# Patient Record
Sex: Male | Born: 1960 | Race: White | Hispanic: No | Marital: Married | Smoking: Never smoker
Health system: Southern US, Community
[De-identification: ages and names within clinical notes are randomized; demographics above are authoritative.]

## PROBLEM LIST (undated history)

## (undated) DIAGNOSIS — I10 Essential (primary) hypertension: Secondary | ICD-10-CM

## (undated) DIAGNOSIS — M1990 Unspecified osteoarthritis, unspecified site: Secondary | ICD-10-CM

## (undated) DIAGNOSIS — E119 Type 2 diabetes mellitus without complications: Secondary | ICD-10-CM

## (undated) DIAGNOSIS — E785 Hyperlipidemia, unspecified: Secondary | ICD-10-CM

## (undated) DIAGNOSIS — T7840XA Allergy, unspecified, initial encounter: Secondary | ICD-10-CM

## (undated) DIAGNOSIS — G709 Myoneural disorder, unspecified: Secondary | ICD-10-CM

## (undated) DIAGNOSIS — K219 Gastro-esophageal reflux disease without esophagitis: Secondary | ICD-10-CM

## (undated) HISTORY — PX: WISDOM TOOTH EXTRACTION: SHX21

## (undated) HISTORY — PX: LAMINECTOMY: SHX219

## (undated) HISTORY — PX: ELBOW SURGERY: SHX618

## (undated) HISTORY — DX: Hyperlipidemia, unspecified: E78.5

## (undated) HISTORY — PX: UPPER GASTROINTESTINAL ENDOSCOPY: SHX188

## (undated) HISTORY — DX: Type 2 diabetes mellitus without complications: E11.9

## (undated) HISTORY — DX: Essential (primary) hypertension: I10

## (undated) HISTORY — DX: Unspecified osteoarthritis, unspecified site: M19.90

## (undated) HISTORY — PX: COLONOSCOPY: SHX174

## (undated) HISTORY — DX: Allergy, unspecified, initial encounter: T78.40XA

## (undated) HISTORY — PX: TRIGGER FINGER RELEASE: SHX641

## (undated) HISTORY — DX: Gastro-esophageal reflux disease without esophagitis: K21.9

## (undated) HISTORY — DX: Myoneural disorder, unspecified: G70.9

---

## 2003-10-20 ENCOUNTER — Encounter: Admission: RE | Admit: 2003-10-20 | Discharge: 2003-10-20 | Payer: Self-pay | Admitting: Orthopedic Surgery

## 2007-12-01 ENCOUNTER — Ambulatory Visit: Payer: Self-pay | Admitting: Gastroenterology

## 2008-01-28 ENCOUNTER — Ambulatory Visit: Payer: Self-pay | Admitting: Gastroenterology

## 2010-10-10 NOTE — Procedures (Signed)
Summary: colonoscopy   Colonoscopy  Procedure date:  01/28/2008  Findings:      Location:  [facility].    Procedures Next Due Date:    Colonoscopy: 01/2013  Patient Name: Micheal Fields, Micheal Fields MRN:  Procedure Procedures: Colonoscopy CPT: 04540.  Personnel: Endoscopist: Rachael Fee, MD.  Referred By: Pearletha Furl Jacky Kindle, MD.  Exam Location: Exam performed in Endoscopy Suite. Outpatient  Patient Consent: Procedure, Alternatives, Risks and Benefits discussed, consent obtained, from patient. Consent was obtained by the RN.  Indications  Increased Risk Screening: For family history of colorectal neoplasia, in  parent  Comments: father had colon cancer History  Current Medications: Patient is not currently taking Coumadin.  Comments: Patient history reviewed and updated, pre-procedure physical performed prior to initiation of sedation? yes Pre-Exam Physical: Performed Jan 28, 2008. Cardio-pulmonary exam, Abdominal exam, Mental status exam WNL.  Exam Exam: Extent of exam reached: Cecum, extent intended: Cecum.  The cecum was identified by appendiceal orifice and IC valve. Patient position: on left side. Time to Cecum: 00:03:11. Time for Withdrawl: 00:08:58. Colon retroflexion performed. Images taken. ASA Classification: II. Tolerance: good.  Monitoring: Pulse and BP monitoring, Oximetry used. Supplemental O2 given.  Colon Prep Prep results: good.  Sedation Meds: Patient assessed and found to be appropriate for moderate (conscious) sedation. Fentanyl 75 mcg. given IV. Versed 8 mg. given IV.  Findings - NORMAL EXAM: Cecum to Rectum. Comments: OTHERWISE NORMAL EXAMINATION.  HEMORRHOIDS: Internal. Size: Small.   Assessment Abnormal examination, see findings above.  Comments: SMALL INTERNAL HEMORRHOIDS, OTHERWISE NORMAL EXAMINATION.  NO COLON POLYPS OR CANCERS.  GIVEN HIS FAMILY HISTORY OF COLON CANCER (FATHER), HE WILL NEED REPEAT EXAMINATION IN  5 YEARS. Events  Unplanned Interventions: No intervention was required.  Unplanned Events: There were no complications. Plans Comments: COLONOSCOPY IN 5 YEARS.    cc. Richard A. Aronson,MD   This report was created from the original endoscopy report, which was reviewed and signed by the above listed endoscopist.

## 2010-10-10 NOTE — Procedures (Signed)
Summary: EGD   EGD  Procedure date:  01/28/2008  Findings:      Location: [facility]    Patient Name: Micheal Fields, Micheal Fields MRN:  Procedure Procedures: Panendoscopy (EGD) CPT: 43235.  Personnel: Endoscopist: Rachael Fee, MD.  Exam Location: Exam performed in Endoscopy Suite. Outpatient  Patient Consent: Procedure, Alternatives, Risks and Benefits discussed, consent obtained, from patient. Consent was obtained by the RN.  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  Comments: Patient history reviewed and updated, pre-procedure physical performed prior to initiation of sedation? yes Pre-Exam Physical: Performed Jan 28, 2008  Cardio-pulmonary exam, Abdominal exam, Mental status exam WNL.  Exam Exam Info: Maximum depth of insertion Duodenum, intended Duodenum. Patient position: on left side. Gastric retroflexion performed. Images taken. ASA Classification: II. Tolerance: good.  Sedation Meds: Patient assessed and found to be appropriate for moderate (conscious) sedation. Residual sedation present from prior procedure today. Fentanyl 25 mcg. given IV. Versed 2 mg. given IV.  Monitoring: BP and pulse monitoring done. Oximetry used. Supplemental O2 given  Findings - Normal: Proximal Esophagus to Duodenal 2nd Portion.   Assessment Normal examination.  Comments: NORMAL EXAMINATION.  NO GERD DAMAGE (BARRETT'S, ESOPHAGITIS, PEPTIC STRICTURES).  HIS CHRONIC GERD SYMPTOMS ARE WELL CONTROLLED ON ONCE DAILY OTC PRILOSEC AND SO HE WILL CONTINUE THAT. Events  Unplanned Intervention: No unplanned interventions were required.  Unplanned Events: There were no complications.   cc. Richard A. Aronson,MD   This report was created from the original endoscopy report, which was reviewed and signed by the  above listed endoscopist.

## 2011-01-03 ENCOUNTER — Observation Stay (HOSPITAL_COMMUNITY)
Admission: EM | Admit: 2011-01-03 | Discharge: 2011-01-05 | DRG: 227 | Disposition: A | Discharge status: Home / Self Care | Payer: BC Managed Care – PPO | Source: Ambulatory Visit | Attending: Orthopedic Surgery | Admitting: Orthopedic Surgery

## 2011-01-03 DIAGNOSIS — I10 Essential (primary) hypertension: Secondary | ICD-10-CM | POA: Insufficient documentation

## 2011-01-03 DIAGNOSIS — Z01810 Encounter for preprocedural cardiovascular examination: Secondary | ICD-10-CM | POA: Insufficient documentation

## 2011-01-03 DIAGNOSIS — B9689 Other specified bacterial agents as the cause of diseases classified elsewhere: Secondary | ICD-10-CM | POA: Insufficient documentation

## 2011-01-03 DIAGNOSIS — M7020 Olecranon bursitis, unspecified elbow: Principal | ICD-10-CM | POA: Insufficient documentation

## 2011-01-03 DIAGNOSIS — Z01812 Encounter for preprocedural laboratory examination: Secondary | ICD-10-CM | POA: Insufficient documentation

## 2011-01-03 DIAGNOSIS — E119 Type 2 diabetes mellitus without complications: Secondary | ICD-10-CM | POA: Insufficient documentation

## 2011-01-03 LAB — CBC
HCT: 39.9 % (ref 39.0–52.0)
Hemoglobin: 14.1 g/dL (ref 13.0–17.0)
MCH: 32.1 pg (ref 26.0–34.0)
MCHC: 35.3 g/dL (ref 30.0–36.0)
MCV: 90.9 fL (ref 78.0–100.0)
Platelets: 220 10*3/uL (ref 150–400)
RBC: 4.39 MIL/uL (ref 4.22–5.81)
RDW: 11.9 % (ref 11.5–15.5)
WBC: 12.9 10*3/uL — ABNORMAL HIGH (ref 4.0–10.5)

## 2011-01-03 LAB — BASIC METABOLIC PANEL
BUN: 12 mg/dL (ref 6–23)
CO2: 28 mEq/L (ref 19–32)
Calcium: 9.3 mg/dL (ref 8.4–10.5)
Chloride: 99 mEq/L (ref 96–112)
Creatinine, Ser: 1.14 mg/dL (ref 0.4–1.5)
GFR calc Af Amer: >60 mL/min (ref >60)
GFR calc non Af Amer: >60 mL/min (ref >60)
Glucose, Bld: 275 mg/dL — ABNORMAL HIGH (ref 70–99)
Potassium: 4.1 mEq/L (ref 3.5–5.1)
Sodium: 134 mEq/L — ABNORMAL LOW (ref 135–145)

## 2011-01-03 LAB — GRAM STAIN

## 2011-01-03 LAB — GLUCOSE, CAPILLARY
Glucose-Capillary: 271 mg/dL — ABNORMAL HIGH (ref 70–99)
Glucose-Capillary: 186 mg/dL — ABNORMAL HIGH (ref 70–99)

## 2011-01-03 LAB — SURGICAL PCR SCREEN
MRSA, PCR: NEGATIVE
Staphylococcus aureus: POSITIVE — AB

## 2011-01-04 LAB — GLUCOSE, CAPILLARY
Glucose-Capillary: 203 mg/dL — ABNORMAL HIGH (ref 70–99)
Glucose-Capillary: 222 mg/dL — ABNORMAL HIGH (ref 70–99)
Glucose-Capillary: 208 mg/dL — ABNORMAL HIGH (ref 70–99)
Glucose-Capillary: 145 mg/dL — ABNORMAL HIGH (ref 70–99)

## 2011-01-05 LAB — GLUCOSE, CAPILLARY
Glucose-Capillary: 235 mg/dL — ABNORMAL HIGH (ref 70–99)
Glucose-Capillary: 192 mg/dL — ABNORMAL HIGH (ref 70–99)
Glucose-Capillary: 147 mg/dL — ABNORMAL HIGH (ref 70–99)

## 2011-01-07 LAB — CULTURE, ROUTINE-ABSCESS

## 2011-01-08 LAB — ANAEROBIC CULTURE

## 2011-01-16 NOTE — Discharge Summary (Signed)
  [name], Micheal Fields                ACCOUNT NO.:  [id number]  MEDICAL RECORD NO.:  [id number]           PATIENT TYPE:  I  LOCATION:  5008                         FACILITY:  [facility]  PHYSICIAN:  Madelynn Done, MD  DATE OF BIRTH:  Nov 08, 1960  DATE OF ADMISSION:  01/03/2011 DATE OF DISCHARGE:  01/05/2011                              DISCHARGE SUMMARY   ADMISSION DIAGNOSIS:  Left elbow septic olecranon bursitis.  POSTOPERATIVE DIAGNOSIS:  Left elbow septic olecranon bursitis.  DISCHARGE DIAGNOSIS:  Left elbow septic olecranon bursitis.  PROCEDURE AND DATE:  Left elbow bursectomy on January 03, 2011.  REASON FOR ADMISSION:  [name] is a 49-year right-hand dominant gentleman who had worsening left elbow septic bursitis.  The patient presented to the office and was taken the operating room on the day of presentation.  HOSPITAL COURSE:  The patient was admitted to the orthopedic floor after the above procedure.  The patient was continued on IV vancomycin.  His cultures did grow gram-positive cocci in pairs.  Final cultures were still pending.  The patient was seen on postoperative day #2.  He was afebrile.  Vital signs were stable and normal.  His wound looked very good.  There was really no redness in his arm.  The patient was felt ready to be discharged to home.  RECOMMENDATIONS AND DISPOSITION:  The patient was discharged home and will be seen back in the office in approximately 4 days for wound check. Continue with a long-arm splint, continue with the oral antibiotics as mentioned below.  He was given my cell phone number if he has any problems.  Prior to discharge, the patient's discharge instructions were explained to him and questions were answered.  The patient voiced understand of discharge instructions.  DISCHARGE MEDICATIONS: 1. Doxycycline 100 mg p.o. b.i.d. 2. Clindamycin 450 mg p.o. t.i.d. 3. Colace 100 mg p.o. b.i.d.. 4. Percocet 5/325 one to two tablets every  4-6 hours as needed for     pain.  CONDITION ON DISCHARGE:  Good.    Madelynn Done, MD    FWO/MEDQ  D:  01/05/2011  T:  01/06/2011  Job:  [id number]  Electronically Signed by Bradly Bienenstock IV MD on 01/16/2011 09:06:15 AM

## 2011-01-16 NOTE — Op Note (Signed)
Micheal Fields, Micheal Fields                ACCOUNT NO.:  [id number]  MEDICAL RECORD NO.:  [id number]           PATIENT TYPE:  I  LOCATION:  5008                         FACILITY:  [facility]  PHYSICIAN:  Madelynn Done, MD  DATE OF BIRTH:  01-Oct-1960  DATE OF PROCEDURE:  01/03/2011 DATE OF DISCHARGE:                              OPERATIVE REPORT   PREOPERATIVE DIAGNOSIS:  Left elbow septic olecranon bursitis.  POSTOPERATIVE DIAGNOSIS:  Left elbow septic olecranon bursitis.  ATTENDING PHYSICIAN:  Madelynn Done, MD scrubbed and present for the entire procedure.  ASSISTANT SURGEON:  None.  SURGICAL PROCEDURE:  Left elbow olecranon bursectomy.  ANESTHESIA:  General via LMA.  Intraoperative cultures, Gram-stain, aerobic and anaerobic, and tissue culture to Pathology.  INTRAOPERATIVE FINDINGS:  The patient did have a rather prominent olecranon bursa with mild amount of purulence within the bursal sac, a lot of inflamed tissue within the bursal region.  Did not have any other deep abscess formation.  SURGICAL INDICATIONS:  Micheal Fields is a 50 year old right-hand-dominant gentleman who presented to the office with worsening pain and swelling in his left elbow.  The patient had been on oral antibiotics.  After talking with him in detail, we would like to proceed with the above procedure.  Risks, benefits, and alternatives were discussed in detail with the patient and signed informed consent was obtained.  Risks include, but not limited to bleeding, infection, damage to nearby nerves, arteries, or tendons, loss of motion of the wrist and digits, and need for further surgical intervention.  DESCRIPTION OF PROCEDURE:  The patient was appropriately identified in the preop holding area, mark with a permanent marker made on the left elbow to indicate correct operative site.  The patient then brought back to the operating room, placed supine on the anesthesia room table where general  anesthesia was administered.  The patient tolerated this well. Antibiotics were held and wound cultures were taken.  Left upper extremity was then prepped and draped in normal sterile fashion.  Time- out was called, correct side was identified, and the procedure then begun.  Dissection was then carried down through the skin and subcutaneous tissue.  The olecranon bursa was then identified and circumferential dissection was then carried around the bursa.  There was mild amount of purulence and large amount of fluid within the bursal sac.  The bursectomy was then carried out.  Following olecranon bursectomy, pulsatile lavage was then used to thoroughly irrigate the area.  Once this was thoroughly irrigated, no other abscess areas were noted after blunt dissection.  After the pulsatile lavage, the hemostasis was obtained with the electrocautery.  The wound was then closed, loosely reapproximated with 3-0 nylon closed over vessel loop. Adaptic dressing, sterile compressive bandage were then applied.  The patient was then placed in a well-padded posterior splint.  Extubated and taken to the recovery room in good condition.  POSTOPERATIVE PLAN:  The patient admitted for IV antibiotics and pain control.  Follow wound cultures, wound check in 48 hours, drain removal, and then based the course based on his pain and condition of the arm.  Madelynn Done, MD     FWO/MEDQ  D:  01/03/2011  T:  01/04/2011  Job:  [id number]  Electronically Signed by Bradly Bienenstock IV MD on 01/16/2011 09:06:17 AM

## 2011-01-23 NOTE — Assessment & Plan Note (Signed)
[facility]                         GASTROENTEROLOGY OFFICE NOTE   NAME:Micheal Fields, Micheal Fields                       MRN:          [id number]  DATE:12/01/2007                            DOB:          02/27/1961    NEW GI CONSULTATION   REFERRING PHYSICIAN:  Geoffry Paradise, M.D.   REASON FOR REFERRAL:  Dr. Jacky Kindle asked me to evaluate Mr. Micheal Fields in  consultation regarding chronic heartburn, a family history of colon  cancer.   HISTORY OF PRESENT ILLNESS:  [name] is a very pleasant 50 year old  man who has had classic GERD symptoms of pyrosis and acid regurgitation  in at least 20 years.  He has been on and off H2 blockers.  He takes on  a rather p.r.n. basis but at least several times a week for acid  regurgitation and pyrosis.  He noticed this definitely is worse when he  drinks caffeine or has a late meal or drinks much alcohol, which he does  sparingly.  For the past two weeks, he has been on samples of Protonix  and has noticed a complete resolution in his symptoms.  He has no  dysphagia.  He has no nausea or vomiting.  No overt GI bleeding.   REVIEW OF SYSTEMS:  Notable for stable weight, otherwise essentially  normal and is available on his nursing intake sheet.   PAST MEDICAL HISTORY:  1. Diabetes type 2.  2. Reiter's syndrome arthritis.  3. Ruptured disk, lumbar, L4-5, in 1982.  4. Chronic GERD symptoms, as above.   CURRENT MEDICATIONS:  1. Protonix 40 mg once daily.  2. __________ Metformin 5/500 b.i.d.  3. Simvastatin.   ALLERGIES:  SULFA ANTIBIOTICS.   SOCIAL HISTORY:  Married with three sons.  Works in [employer].  Nonsmoker.  Drinks alcohol, maybe one glass of alcohol a night.  One cup  of coffee, maybe two a day.   FAMILY HISTORY:  His father had colon cancer, although survived with a  polypectomy, followed by segmental colectomy.  At the time of his  segmental colectomy, there was no sign of residual cancer.   PHYSICAL  EXAMINATION:  Height 5 foot 11 inches.  Weight 218 pounds.  Blood pressure 148/92, pulse 88.  CONSTITUTIONAL:  Generally well-appearing.  NEUROLOGIC:  Alert and oriented x3.  HEENT:  Eyes:  Extraocular movements intact.  Mouth:  Oropharynx moist.  No lesions.  NECK:  Supple.  No lymphadenopathy.  CARDIOVASCULAR:  Heart has a regular rate and rhythm.  ABDOMEN:  Soft, nontender, nondistended.  Normal bowel sounds.  EXTREMITIES:  No lower extremity edema.  SKIN:  No rash or lesions on visible extremities.   ASSESSMENT/PLAN:  A 50 year old man with chronic gastroesophageal reflux  symptoms (without any alarm symptoms).  Family history of colon cancer.  Protonix is definitely helping him on a daily basis, and he knows that  he should just continue that for now.  I have discussed other lifestyle  modifications such as caffeine intake, chocolate, peppermint, alcohol  consumption as all possibly contributing to his chronic gastroesophageal  reflux symptoms.  He has gained about  10 pounds in the past year, so  that also can contribute.  He should undergo an upper endoscopy to  screen for Barrett's and other chronic complications of gastroesophageal  reflux disease, although hopefully that will not be the case.  At the  same time, I will arrange for him to have a full colonoscopy.  His  father had colon cancer, and he has not yet had a colonoscopy.  He only  receives samples of the Protonix, so I will call him in a prescription  for PPI, which he knows to take for now.     Rachael Fee, MD  Electronically Signed    DPJ/MedQ  DD: 12/01/2007  DT: 12/01/2007  Job #: [id number]   cc:   Geoffry Paradise, M.D.

## 2012-12-30 ENCOUNTER — Encounter: Payer: Self-pay | Admitting: Gastroenterology

## 2013-01-16 ENCOUNTER — Encounter: Payer: Self-pay | Admitting: Gastroenterology

## 2013-02-17 ENCOUNTER — Ambulatory Visit (AMBULATORY_SURGERY_CENTER): Payer: BC Managed Care – PPO

## 2013-02-17 ENCOUNTER — Encounter: Payer: Self-pay | Admitting: Gastroenterology

## 2013-02-17 VITALS — Ht 70.5 in | Wt 193.8 lb

## 2013-02-17 DIAGNOSIS — Z800 Family history of malignant neoplasm of digestive organs: Secondary | ICD-10-CM

## 2013-02-17 DIAGNOSIS — Z1211 Encounter for screening for malignant neoplasm of colon: Secondary | ICD-10-CM

## 2013-02-17 MED ORDER — MOVIPREP 100 G PO SOLR: ORAL | Status: DC

## 2013-03-03 ENCOUNTER — Ambulatory Visit (AMBULATORY_SURGERY_CENTER): Payer: BC Managed Care – PPO | Admitting: Gastroenterology

## 2013-03-03 ENCOUNTER — Encounter: Payer: Self-pay | Admitting: Gastroenterology

## 2013-03-03 VITALS — BP 101/62 | HR 54 | Temp 97.4°F | Resp 25 | Ht 70.5 in | Wt 193.0 lb

## 2013-03-03 DIAGNOSIS — Z800 Family history of malignant neoplasm of digestive organs: Secondary | ICD-10-CM

## 2013-03-03 DIAGNOSIS — D126 Benign neoplasm of colon, unspecified: Secondary | ICD-10-CM

## 2013-03-03 DIAGNOSIS — Z1211 Encounter for screening for malignant neoplasm of colon: Secondary | ICD-10-CM

## 2013-03-03 LAB — GLUCOSE, CAPILLARY
Glucose-Capillary: 107 mg/dL — ABNORMAL HIGH (ref 70–99)
Glucose-Capillary: 97 mg/dL (ref 70–99)

## 2013-03-03 MED ORDER — SODIUM CHLORIDE 0.9 % IV SOLN: 500.0000 mL | INTRAVENOUS | Status: DC

## 2013-03-03 NOTE — Op Note (Signed)
[facility] [address] [place], [address]   COLONOSCOPY PROCEDURE REPORT  PATIENT: Micheal, Fields  MR#: [id number] BIRTHDATE: 08-03-1961 , 52  yrs. old GENDER: Male ENDOSCOPIST: Rachael Fee, MD REFERRED [id number] Jacky Kindle, M.D. PROCEDURE DATE:  03/03/2013 PROCEDURE:   Colonoscopy with snare polypectomy ASA CLASS:   Class II INDICATIONS:elevated risk screening, father had colon cancer; colonoscopy 2009 without polyps MEDICATIONS: Fentanyl 100 mcg IV, Versed 7 mg IV, and These medications were titrated to patient response per physician's verbal order  DESCRIPTION OF PROCEDURE:   After the risks benefits and alternatives of the procedure were thoroughly explained, informed consent was obtained.  A digital rectal exam revealed no abnormalities of the rectum.   The [id number]  endoscope was introduced through the anus and advanced to the cecum, which was identified by both the appendix and ileocecal valve. No adverse events experienced.   The quality of the prep was good.  The instrument was then slowly withdrawn as the colon was fully examined.   COLON FINDINGS: One polyp was found and removed.  This was 2mm across, located in transverse segment, sessile, removed with cold snare; not retrieved.  The examination was otherwise normal. Retroflexed views revealed no abnormalities. The time to cecum=2 minutes 00 seconds.  Withdrawal time=8 minutes 33 seconds.  The scope was withdrawn and the procedure completed. COMPLICATIONS: There were no complications.  ENDOSCOPIC IMPRESSION: One polyp was found and removed. The examination was otherwise normal.  RECOMMENDATIONS: Given your significant family history of colon cancer, you should have a repeat colonoscopy in 5 years   eSigned:  Rachael Fee, MD 03/03/2013 9:52 AM

## 2013-03-03 NOTE — Patient Instructions (Addendum)
Discharge instructions given with verbal understanding. Handout on polyp given. Resume previous medications. YOU HAD AN ENDOSCOPIC PROCEDURE TODAY AT THE [facility]: Refer to the procedure report that was given to you for any specific questions about what was found during the examination.  If the procedure report does not answer your questions, please call your gastroenterologist to clarify.  If you requested that your care partner not be given the details of your procedure findings, then the procedure report has been included in a sealed envelope for you to review at your convenience later.  YOU SHOULD EXPECT: Some feelings of bloating in the abdomen. Passage of more gas than usual.  Walking can help get rid of the air that was put into your GI tract during the procedure and reduce the bloating. If you had a lower endoscopy (such as a colonoscopy or flexible sigmoidoscopy) you may notice spotting of blood in your stool or on the toilet paper. If you underwent a bowel prep for your procedure, then you may not have a normal bowel movement for a few days.  DIET: Your first meal following the procedure should be a light meal and then it is ok to progress to your normal diet.  A half-sandwich or bowl of soup is an example of a good first meal.  Heavy or fried foods are harder to digest and may make you feel nauseous or bloated.  Likewise meals heavy in dairy and vegetables can cause extra gas to form and this can also increase the bloating.  Drink plenty of fluids but you should avoid alcoholic beverages for 24 hours.  ACTIVITY: Your care partner should take you home directly after the procedure.  You should plan to take it easy, moving slowly for the rest of the day.  You can resume normal activity the day after the procedure however you should NOT DRIVE or use heavy machinery for 24 hours (because of the sedation medicines used during the test).    SYMPTOMS TO REPORT IMMEDIATELY: A  gastroenterologist can be reached at any hour.  During normal business hours, 8:30 AM to 5:00 PM Monday through Friday, call [phone].  After hours and on weekends, please call the GI answering service at [phone] who will take a message and have the physician on call contact you.   Following lower endoscopy (colonoscopy or flexible sigmoidoscopy):  Excessive amounts of blood in the stool  Significant tenderness or worsening of abdominal pains  Swelling of the abdomen that is new, acute  Fever of 100F or higher   FOLLOW UP: If any biopsies were taken you will be contacted by phone or by letter within the next 1-3 weeks.  Call your gastroenterologist if you have not heard about the biopsies in 3 weeks.  Our staff will call the home number listed on your records the next business day following your procedure to check on you and address any questions or concerns that you may have at that time regarding the information given to you following your procedure. This is a courtesy call and so if there is no answer at the home number and we have not heard from you through the emergency physician on call, we will assume that you have returned to your regular daily activities without incident.  SIGNATURES/CONFIDENTIALITY: You and/or your care partner have signed paperwork which will be entered into your electronic medical record.  These signatures attest to the fact that that the information above on your After Visit Summary   has been reviewed and is understood.  Full responsibility of the confidentiality of this discharge information lies with you and/or your care-partner.  

## 2013-03-03 NOTE — Progress Notes (Signed)
Patient did not experience any of the following events: a burn prior to discharge; a fall within the facility; wrong site/side/patient/procedure/implant event; or a hospital transfer or hospital admission upon discharge from the facility. (G8907) Patient did not have preoperative order for IV antibiotic SSI prophylaxis. (G8918)  

## 2013-03-04 ENCOUNTER — Telehealth: Payer: Self-pay

## 2013-03-04 NOTE — Telephone Encounter (Signed)
Left message on answering machine. 

## 2014-09-01 ENCOUNTER — Emergency Department (HOSPITAL_COMMUNITY)
Admission: EM | Admit: 2014-09-01 | Discharge: 2014-09-02 | Disposition: A | Discharge status: Home / Self Care | Payer: BC Managed Care – PPO | Attending: Emergency Medicine | Admitting: Emergency Medicine

## 2014-09-01 ENCOUNTER — Emergency Department (HOSPITAL_COMMUNITY): Payer: BC Managed Care – PPO

## 2014-09-01 ENCOUNTER — Encounter (HOSPITAL_COMMUNITY): Payer: Self-pay | Admitting: Emergency Medicine

## 2014-09-01 DIAGNOSIS — E119 Type 2 diabetes mellitus without complications: Secondary | ICD-10-CM | POA: Insufficient documentation

## 2014-09-01 DIAGNOSIS — W19XXXA Unspecified fall, initial encounter: Secondary | ICD-10-CM

## 2014-09-01 DIAGNOSIS — Y9389 Activity, other specified: Secondary | ICD-10-CM | POA: Insufficient documentation

## 2014-09-01 DIAGNOSIS — E785 Hyperlipidemia, unspecified: Secondary | ICD-10-CM | POA: Diagnosis not present

## 2014-09-01 DIAGNOSIS — Y9289 Other specified places as the place of occurrence of the external cause: Secondary | ICD-10-CM | POA: Diagnosis not present

## 2014-09-01 DIAGNOSIS — S02119A Unspecified fracture of occiput, initial encounter for closed fracture: Secondary | ICD-10-CM | POA: Insufficient documentation

## 2014-09-01 DIAGNOSIS — Z79899 Other long term (current) drug therapy: Secondary | ICD-10-CM | POA: Insufficient documentation

## 2014-09-01 DIAGNOSIS — S0291XA Unspecified fracture of skull, initial encounter for closed fracture: Secondary | ICD-10-CM

## 2014-09-01 DIAGNOSIS — Y998 Other external cause status: Secondary | ICD-10-CM | POA: Diagnosis not present

## 2014-09-01 DIAGNOSIS — I10 Essential (primary) hypertension: Secondary | ICD-10-CM | POA: Insufficient documentation

## 2014-09-01 DIAGNOSIS — W01198A Fall on same level from slipping, tripping and stumbling with subsequent striking against other object, initial encounter: Secondary | ICD-10-CM | POA: Insufficient documentation

## 2014-09-01 DIAGNOSIS — R55 Syncope and collapse: Secondary | ICD-10-CM | POA: Insufficient documentation

## 2014-09-01 DIAGNOSIS — Z794 Long term (current) use of insulin: Secondary | ICD-10-CM | POA: Diagnosis not present

## 2014-09-01 DIAGNOSIS — K219 Gastro-esophageal reflux disease without esophagitis: Secondary | ICD-10-CM | POA: Insufficient documentation

## 2014-09-01 DIAGNOSIS — S0990XA Unspecified injury of head, initial encounter: Secondary | ICD-10-CM | POA: Diagnosis present

## 2014-09-01 DIAGNOSIS — R4020 Unspecified coma: Secondary | ICD-10-CM

## 2014-09-01 LAB — CBG MONITORING, ED: Glucose-Capillary: 336 mg/dL — ABNORMAL HIGH (ref 70–99)

## 2014-09-01 NOTE — ED Notes (Signed)
Bed: WA09 Expected date: 09/01/14 Expected time: 11:12 PM Means of arrival: Ambulance Comments: Back pain

## 2014-09-01 NOTE — ED Notes (Signed)
Pt. CBG 336. Notified RN,Francis.

## 2014-09-01 NOTE — ED Notes (Addendum)
Pt's family states that he has been drinking and fell hitting his head on the concrete. Pt loss consciousness per family who witnessed the fall. Pt has a wound on the back of his head. Bleeding is controlled. Pt is still intoxicated and unsteady on his feet.

## 2014-09-02 NOTE — Discharge Instructions (Signed)
1. Medications: usual home medications 2. Treatment: rest, drink plenty of fluids,  3. Follow Up: Please followup with Dr. Joya Salm in 10 days for further evaluation; Please return to the ER for confusion, seizures, vomiting, severe headaches    Skull Fracture, Uncomplicated, Adult You have a skull fracture. This happens when one of the bones of your head cracks or breaks. Your injury does not appear serious at this time and we feel you can be observed safely at home. An injury to the head that causes a skull fracture may also cause a concussion. With a concussion you may be knocked out for a brief moment (loss of consciousness). A concussion is the mildest form of traumatic brain injury. The symptoms of a concussion are short-lived and resolve on their own. The most common symptoms are confusion and forgetting what happened (amnesia). SYMPTOMS These minor symptoms may be experienced after discharge:  Memory difficulties.  Dizziness.  Headaches.  Hearing difficulties.  Vertigo or trouble with balance.  Depression.  Tiredness.  Difficulty with concentration.  Nausea.  Vomiting. A bruise on the brain (concussion) requires a few days for recovery the same as a bruise elsewhere on your body. It is common for people with injuries such as yours to experience such problems. Usually these problems disappear without medical care. If symptoms remain for more than a few days, notify your caregiver. See your caregiver sooner if symptoms are becoming worse rather than better. HOME CARE INSTRUCTIONS   During the next 24 hours you should stay with an adult who can watch you for the above warning signs.  This person should wake you up every 02-03 hours to check on your condition, noting any of the above signs or symptoms. Problems which are getting worse mean you should call or return immediately to the facility where you were just seen, or to the nearest emergency department. In case of emergency  or unconsciousness, call for local emergency medical help.  You should take clear liquids for the rest of the day and then resume your regular diet.  You should not take sedatives or alcoholic beverages for 48 hours after discharge.  After injuries such as yours, most serious problems happen within the first 24 hours.  If x-rays or other testing were done, make sure you know how you are to get those results. It is your responsibility to call back for results when your caregiver suggests. Do not assume everything is fine if your caregiver has not been able to reach you.  Skull fractures usually do not need follow up x-rays. These fractures are not like broken arms. The position of the skull stays the same as when it was broken and usually heals without problems.  If you have a concussion be aware that symptoms may last up to a week or longer.  It is very important to keep any follow-up appointments after a head injury.  It is unlikely that serious side effects will occur. If they do occur it is usually soon after the accident but may occur as long as a week after the accident or injury. SEEK IMMEDIATE MEDICAL CARE IF:   Confusion or drowsiness develops; children frequently become drowsy after any type of trauma or injury.  A person cannot arouse the injured person.  You feel sick to your stomach (nausea) or persistent, forceful vomiting (projectile in nature).  Rapid back and forth movement of the eyes. This is called nystagmus.  Convulsions, seizures, or unconsciousness.  Severe persistent headaches not relieved  by medication. Only take over-the-counter or prescription medicines for pain, discomfort, or fever as directed by your caregiver. (Do not take aspirin as this weakens blood clotting abilities).  Inability to use arms or legs appropriately.  Changes in pupil sizes.  Clear or bloody discharge from nose or ears. Document Released: 06/27/2004 Document Revised: 11/19/2011  Document Reviewed: 11/02/2008 [facility] Patient Information 2015 [place], [place]. This information is not intended to replace advice given to you by your health care provider. Make sure you discuss any questions you have with your health care provider.

## 2014-09-02 NOTE — ED Provider Notes (Signed)
CSN: [id number]     Arrival date & time 09/01/14  2315 History   First MD Initiated Contact with Patient 09/01/14 2330     Chief Complaint  Patient presents with  . Fall     (Consider location/radiation/quality/duration/timing/severity/associated sxs/prior Treatment) Patient is a 53 y.o. male presenting with fall. The history is provided by the patient and medical records. No language interpreter was used.  Fall Pertinent negatives include no abdominal pain, chest pain, coughing, diaphoresis, fatigue, fever, headaches, nausea, rash or vomiting.     Micheal Fields is a 53 y.o. male  with a hx of insulin-dependent diabetes, hypertension, hyperlipidemia, GERD presents to the Emergency Department with his family after drinking heavily tonight. Patient then sustained a mechanical fall, falling backwards and hitting his head on the ground. Patient's son at bedside reports approximately 2 minutes of unconsciousness.  Patient reports he remembers falling and waking up on the ground.  Patient denies use of blood thinners. He denies pain anywhere. Patient's son reports he does have a wound to the occiput of his head.  Patient denies neck pain, back pain, chest pain, shortness of breath, syncope prior to the fall, abdominal pain, nausea, vomiting, diarrhea, weakness, dizziness, numbness, tingling, saddle anesthesia, loss of bowel or bladder control. Patient son witnessed the episode and denies seizure-like activity. Patient states he drinks moderately approximately 3-4 nights per week.  Past Medical History  Diagnosis Date  . Diabetes mellitus without complication   . Hypertension   . Hyperlipidemia   . GERD (gastroesophageal reflux disease)    Past Surgical History  Procedure Laterality Date  . Laminectomy      l 4-5  . Elbow surgery      left elbow  . Wisdom tooth extraction    . Colonoscopy    . Upper gastrointestinal endoscopy     Family History  Problem Relation Age of Onset  .  Diabetes Mother   . Colon cancer Father    History  Substance Use Topics  . Smoking status: Never Smoker   . Smokeless tobacco: Current User    Types: Chew     Comment: occasional  . Alcohol Use: 3.6 oz/week    6 Cans of beer per week    Review of Systems  Constitutional: Negative for fever, diaphoresis, appetite change, fatigue and unexpected weight change.  HENT: Negative for mouth sores.   Eyes: Negative for visual disturbance.  Respiratory: Negative for cough, chest tightness, shortness of breath and wheezing.   Cardiovascular: Negative for chest pain.  Gastrointestinal: Negative for nausea, vomiting, abdominal pain, diarrhea and constipation.  Endocrine: Negative for polydipsia, polyphagia and polyuria.  Genitourinary: Negative for dysuria, urgency, frequency and hematuria.  Musculoskeletal: Negative for back pain and neck stiffness.  Skin: Positive for wound. Negative for rash.  Allergic/Immunologic: Negative for immunocompromised state.  Neurological: Negative for syncope, light-headedness and headaches.  Hematological: Does not bruise/bleed easily.  Psychiatric/Behavioral: Negative for sleep disturbance. The patient is not nervous/anxious.       Allergies  Sulfa antibiotics  Home Medications   Prior to Admission medications   Medication Sig Start Date End Date Taking? Authorizing Provider  insulin glargine (LANTUS) 100 UNIT/ML injection Inject 40 Units into the skin daily.   Yes Historical Provider, MD  lisinopril (PRINIVIL,ZESTRIL) 20 MG tablet Take 20 mg by mouth daily.   Yes Historical Provider, MD  omeprazole (PRILOSEC) 20 MG capsule Take 20 mg by mouth daily.   Yes Historical Provider, MD  simvastatin (ZOCOR) 40  MG tablet Take 40 mg by mouth every evening.   Yes Historical Provider, MD  sitaGLIPtin-metformin (JANUMET) 50-1000 MG per tablet Take 1 tablet by mouth 2 (two) times daily with a meal.   Yes Historical Provider, MD  MOVIPREP Humphreys as  directed / no substitutions Patient not taking: Reported on 09/01/2014 02/17/13   Milus Banister, MD   BP 135/67 mmHg  Pulse 110  Temp(Src) 98.2 F (36.8 C) (Oral)  Resp 18  SpO2 97% Physical Exam  Constitutional: He is oriented to person, place, and time. He appears well-developed and well-nourished. No distress.  Awake, alert, nontoxic appearance  HENT:  Head: Normocephalic and atraumatic.  Mouth/Throat: Oropharynx is clear and moist. No oropharyngeal exudate.  Eyes: Conjunctivae and EOM are normal. Pupils are equal, round, and reactive to light. No scleral icterus.  No horizontal, vertical or rotational nystagmus  Neck: Normal range of motion. Neck supple.  Full active and passive ROM without pain No midline or paraspinal tenderness No nuchal rigidity or meningeal signs  Cardiovascular: Normal rate, regular rhythm and intact distal pulses.   Pulmonary/Chest: Effort normal and breath sounds normal. No respiratory distress. He has no wheezes. He has no rales.  Equal chest expansion  Abdominal: Soft. Bowel sounds are normal. He exhibits no distension and no mass. There is no tenderness. There is no rebound and no guarding.  Musculoskeletal: Normal range of motion. He exhibits no edema.  Full range of motion of the T-spine and L-spine No tenderness to palpation of the spinous processes of the T-spine or L-spine No tenderness to palpation of the paraspinous muscles of the L-spine  Lymphadenopathy:    He has no cervical adenopathy.  Neurological: He is alert and oriented to person, place, and time. He has normal reflexes. No cranial nerve deficit. He exhibits normal muscle tone. Coordination normal.  Reflex Scores:      Bicep reflexes are 2+ on the right side and 2+ on the left side.      Brachioradialis reflexes are 2+ on the right side and 2+ on the left side.      Patellar reflexes are 2+ on the right side and 2+ on the left side.      Achilles reflexes are 2+ on the right side  and 2+ on the left side. Mental Status:  Alert, oriented, thought content appropriate. Speech fluent without evidence of aphasia; slurred. Able to follow 2 step commands without difficulty.  Cranial Nerves:  II:  Peripheral visual fields grossly normal, pupils equal, round, reactive to light III,IV, VI: ptosis not present, extra-ocular motions intact bilaterally  V,VII: smile symmetric, facial light touch sensation equal VIII: hearing grossly normal bilaterally  XI: bilateral shoulder shrug equal and strong XII: midline tongue extension  Motor:  5/5 in upper and lower extremities bilaterally including strong and equal grip strength and dorsiflexion/plantar flexion Sensory: Pinprick and light touch normal in all extremities.  Deep Tendon Reflexes: 2+ and symmetric  Cerebellar: finger-to-nose with bilateral upper extremities, slightly uncoordinated but without ataxia or past pointing Gait: normal gait and balance CV: distal pulses palpable throughout   Skin: Skin is warm and dry. No rash noted. He is not diaphoretic. No erythema.  Psychiatric: He has a normal mood and affect. His behavior is normal. Judgment and thought content normal.  Nursing note and vitals reviewed.   ED Course  Procedures (including critical care time) Labs Review Labs Reviewed  CBG MONITORING, ED - Abnormal; Notable for the following:  Glucose-Capillary 336 (*)    All other components within normal limits    Imaging Review Ct Head Wo Contrast  09/02/2014   CLINICAL DATA:  Status post fall, hitting head on concrete. Loss of consciousness. Soft tissue wound at the back of the head. Concern for cervical spine injury. Initial encounter.  EXAM: CT HEAD WITHOUT CONTRAST  CT CERVICAL SPINE WITHOUT CONTRAST  TECHNIQUE: Multidetector CT imaging of the head and cervical spine was performed following the standard protocol without intravenous contrast. Multiplanar CT image reconstructions of the cervical spine were also  generated.  COMPARISON:  None.  FINDINGS: CT HEAD FINDINGS  There is no evidence of acute infarction, mass lesion, or intra- or extra-axial hemorrhage on CT. A vague focus of increased attenuation at the inferior left frontal lobe is thought to reflect volume averaging rather than hemorrhage.  The posterior fossa, including the cerebellum, brainstem and fourth ventricle, is within normal limits. The third and lateral ventricles, and basal ganglia are unremarkable in appearance. The cerebral hemispheres are symmetric in appearance, with normal gray-white differentiation. No mass effect or midline shift is seen.  There is a fracture extending across the left occiput, to the left side of the base of the skull. It is partially characterized at the base of the skull on cervical spine images, where it appears to pass medial to vascular channels. No blood is seen within the left mastoid air cells. The orbits are within normal limits. The paranasal sinuses and mastoid air cells are well-aerated. The known soft tissue wound is not well characterized on CT.  CT CERVICAL SPINE FINDINGS  There is no evidence of fracture or subluxation. Vertebral bodies demonstrate normal height and alignment. There is slight narrowing of the intervertebral disc space at C5-C6. Prevertebral soft tissues are within normal limits. The visualized neural foramina are grossly unremarkable.  The thyroid gland is unremarkable in appearance. The visualized lung apices are clear. No significant soft tissue abnormalities are seen. Fluid within the mid esophagus is nonspecific and may be transient in nature.  IMPRESSION: 1. No evidence of traumatic intracranial injury. Vague focus of increased attenuation at the inferior left frontal lobe is thought to reflect volume averaging rather than hemorrhage. 2. Nondisplaced fracture extending across the left occiput, down to the left side of the base of the skull. It appears to pass medial to vascular channels,  without evidence of blood at the left mastoid air cells. 3. No evidence of fracture or subluxation along the cervical spine.  These results were called by telephone at the time of interpretation on 09/02/2014 at 12:24 am to [facility] PA, who verbally acknowledged these results.   Electronically Signed   By: Garald Balding M.D.   On: 09/02/2014 00:26   Ct Cervical Spine Wo Contrast  09/02/2014   CLINICAL DATA:  Status post fall, hitting head on concrete. Loss of consciousness. Soft tissue wound at the back of the head. Concern for cervical spine injury. Initial encounter.  EXAM: CT HEAD WITHOUT CONTRAST  CT CERVICAL SPINE WITHOUT CONTRAST  TECHNIQUE: Multidetector CT imaging of the head and cervical spine was performed following the standard protocol without intravenous contrast. Multiplanar CT image reconstructions of the cervical spine were also generated.  COMPARISON:  None.  FINDINGS: CT HEAD FINDINGS  There is no evidence of acute infarction, mass lesion, or intra- or extra-axial hemorrhage on CT. A vague focus of increased attenuation at the inferior left frontal lobe is thought to reflect volume averaging rather than hemorrhage.  The posterior fossa, including the cerebellum, brainstem and fourth ventricle, is within normal limits. The third and lateral ventricles, and basal ganglia are unremarkable in appearance. The cerebral hemispheres are symmetric in appearance, with normal gray-white differentiation. No mass effect or midline shift is seen.  There is a fracture extending across the left occiput, to the left side of the base of the skull. It is partially characterized at the base of the skull on cervical spine images, where it appears to pass medial to vascular channels. No blood is seen within the left mastoid air cells. The orbits are within normal limits. The paranasal sinuses and mastoid air cells are well-aerated. The known soft tissue wound is not well characterized on CT.  CT CERVICAL  SPINE FINDINGS  There is no evidence of fracture or subluxation. Vertebral bodies demonstrate normal height and alignment. There is slight narrowing of the intervertebral disc space at C5-C6. Prevertebral soft tissues are within normal limits. The visualized neural foramina are grossly unremarkable.  The thyroid gland is unremarkable in appearance. The visualized lung apices are clear. No significant soft tissue abnormalities are seen. Fluid within the mid esophagus is nonspecific and may be transient in nature.  IMPRESSION: 1. No evidence of traumatic intracranial injury. Vague focus of increased attenuation at the inferior left frontal lobe is thought to reflect volume averaging rather than hemorrhage. 2. Nondisplaced fracture extending across the left occiput, down to the left side of the base of the skull. It appears to pass medial to vascular channels, without evidence of blood at the left mastoid air cells. 3. No evidence of fracture or subluxation along the cervical spine.  These results were called by telephone at the time of interpretation on 09/02/2014 at 12:24 am to [facility] PA, who verbally acknowledged these results.   Electronically Signed   By: Garald Balding M.D.   On: 09/02/2014 00:26     EKG Interpretation None      MDM   Final diagnoses:  Fall  LOC (loss of consciousness)  Skull fracture, closed, initial encounter   Micheal Fields presents with history of significant EtOH consumption and mechanical fall tonight with loss of consciousness. Will obtain CT head, neck and check blood sugar.  12:33 AM CT with Nondisplaced fracture extending across the left occiput, down to the left side of the base of the skull. It appears to pass medial to vascular channels, without evidence of blood at the left mastoid air cells.  Will discuss with neurosurgery.    1:00AM Patient discussed with Dr. Joya Salm he reports that he is safe to be discharged home once he is clinically sober  and remains without neurologic deficits. He is to follow-up with neurosurgery in 10 days for further evaluation.  The patient was discussed with and seen by Dr. Florina Ou who agrees with the treatment plan.  Dr. Florina Ou will monitor for sobriety and reassess neurologic exam.    BP 135/67 mmHg  Pulse 110  Temp(Src) 98.2 F (36.8 C) (Oral)  Resp 18  SpO2 97%    Abigail Butts, PA-C 09/02/14 0135  Wynetta Fines, MD 09/02/14 0403

## 2014-09-09 ENCOUNTER — Other Ambulatory Visit: Payer: Self-pay | Admitting: Neurosurgery

## 2014-09-09 DIAGNOSIS — S0291XA Unspecified fracture of skull, initial encounter for closed fracture: Secondary | ICD-10-CM

## 2014-09-13 ENCOUNTER — Ambulatory Visit
Admission: RE | Admit: 2014-09-13 | Discharge: 2014-09-13 | Disposition: A | Discharge status: Home / Self Care | Payer: BC Managed Care – PPO | Source: Ambulatory Visit | Attending: Neurosurgery | Admitting: Neurosurgery

## 2014-09-13 DIAGNOSIS — S0291XA Unspecified fracture of skull, initial encounter for closed fracture: Secondary | ICD-10-CM

## 2015-01-20 ENCOUNTER — Encounter: Payer: Self-pay | Admitting: Neurology

## 2015-01-20 ENCOUNTER — Ambulatory Visit (INDEPENDENT_AMBULATORY_CARE_PROVIDER_SITE_OTHER): Payer: BLUE CROSS/BLUE SHIELD | Admitting: Neurology

## 2015-01-20 VITALS — BP 127/75 | HR 106 | Ht 70.5 in | Wt 205.0 lb

## 2015-01-20 DIAGNOSIS — IMO0001 Reserved for inherently not codable concepts without codable children: Secondary | ICD-10-CM

## 2015-01-20 DIAGNOSIS — H8110 Benign paroxysmal vertigo, unspecified ear: Secondary | ICD-10-CM | POA: Diagnosis not present

## 2015-01-20 DIAGNOSIS — R430 Anosmia: Secondary | ICD-10-CM | POA: Insufficient documentation

## 2015-01-20 DIAGNOSIS — R442 Other hallucinations: Secondary | ICD-10-CM

## 2015-01-20 DIAGNOSIS — S04819A Injury of olfactory [1st ] nerve, unspecified side, initial encounter: Secondary | ICD-10-CM | POA: Diagnosis not present

## 2015-01-20 NOTE — Progress Notes (Signed)
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 male here as a referral from Dr. Reynaldo Minium for os concussive disorder  In December he slipped and doesn't remember anything after that. His head popped on the cement. He had a hairline fracture. Headaches were incredible for 2 weeks and now it is gone, fatigue is improved he would sleep a lot. Has had 4 concussions in the past. He is smelling strange things that are not there, the same smell every time.  His taste and smell are not normal since then. He perspires and sweats and has a bad odor than before. He can taste certain things but not others. He can't smell coffee. He can't smell yankee candles. He also has vertigo occasionally when he moves his head. When he is laying and moves quickly he gets dizzy. It is concerning to him.  The strange smells are concerning to him, worsening.   Reviewed notes, labs and imaging from outside physicians, which showed: Recently reviewed images and agree with findings  CT 09/13/2014:   IMPRESSION: 1. Stable left paramedian nondisplaced occipital fracture. 2. Bifrontal inferior hemorrhagic contusion is more apparent on today's study with some anterior mass effect resulting and sulcal effacement. There is no hydrocephalus CT head: 12/23 IMPRESSION: 1. No evidence of traumatic intracranial injury. Vague focus of increased attenuation at the inferior left frontal lobe is thought to reflect volume averaging rather than hemorrhage. 2. Nondisplaced fracture extending across the left occiput, down to the left side of the base of the skull. It appears to pass medial to vascular channels, without evidence of blood at the left mastoid air cells. 3. No evidence of fracture or subluxation along the cervical spine.   Review of Systems: Patient complains  of symptoms per HPI as well as the following symptoms: Dizziness, sleepiness, bad smells. Pertinent negatives per HPI. All others negative.   History   Social History  . Marital Status: Married    Spouse Name: Hector Shade"  . Number of Children: 3  . Years of Education: Ba    Occupational History  . [employer] office    Social History Main Topics  . Smoking status: Never Smoker   . Smokeless tobacco: Current User    Types: Chew     Comment: occasional  . Alcohol Use: 3.6 oz/week    6 Cans of beer per week  . Drug Use: No  . Sexual Activity: Not on file   Other Topics Concern  . Not on file   Social History Narrative   Lives at home with wife   Caffeine use: Drinks 1 cup coffee in morning       Family History  Problem Relation Age of Onset  . Diabetes Mother   . Colon cancer Father     Past Medical History  Diagnosis Date  . Diabetes mellitus without complication   . Hypertension   . Hyperlipidemia   . GERD (gastroesophageal reflux disease)     Past Surgical History  Procedure Laterality Date  . Laminectomy      l 4-5  . Elbow surgery      left elbow  . Wisdom tooth extraction    . Colonoscopy    . Upper gastrointestinal endoscopy      Current Outpatient Prescriptions  Medication Sig Dispense Refill  .  insulin glargine (LANTUS) 100 UNIT/ML injection Inject 40 Units into the skin daily.    Marland Kitchen lisinopril (PRINIVIL,ZESTRIL) 20 MG tablet Take 20 mg by mouth daily.    Marland Kitchen omeprazole (PRILOSEC) 20 MG capsule Take 20 mg by mouth daily.    . simvastatin (ZOCOR) 40 MG tablet Take 40 mg by mouth daily.     . sitaGLIPtin-metformin (JANUMET) 50-1000 MG per tablet Take 1 tablet by mouth 2 (two) times daily with a meal.    . ONE TOUCH ULTRA TEST test strip   10   No current facility-administered medications for this visit.    Allergies as of 01/20/2015 - Review Complete 01/20/2015  Allergen Reaction Noted  . Sulfa antibiotics  02/17/2013    Vitals: BP 127/75  mmHg  Pulse 106  Ht 5' 10.5" (1.791 m)  Wt 205 lb (92.987 kg)  BMI 28.99 kg/m2 Last Weight:  Wt Readings from Last 1 Encounters:  01/20/15 205 lb (92.987 kg)   Last Height:   Ht Readings from Last 1 Encounters:  01/20/15 5' 10.5" (1.791 m)   Physical exam: Exam: Gen: NAD, conversant, well nourised, well groomed                     CV: RRR, no MRG. No Carotid Bruits. No peripheral edema, warm, nontender Eyes: Conjunctivae clear without exudates or hemorrhage  Neuro: Detailed Neurologic Exam  Speech:    Speech is normal; fluent and spontaneous with normal comprehension.  Cognition:    The patient is oriented to person, place, and time;     recent and remote memory intact;     language fluent;     normal attention, concentration,     fund of knowledge Cranial Nerves:    The pupils are equal, round, and reactive to light. The fundi are normal and spontaneous venous pulsations are present. Visual fields are full to finger confrontation. Extraocular movements are intact. Trigeminal sensation is intact and the muscles of mastication are normal. The face is symmetric. The palate elevates in the midline. Hearing intact. Voice is normal. Shoulder shrug is normal. The tongue has normal motion without fasciculations.   Coordination:    Normal finger to nose and heel to shin. Normal rapid alternating movements.   Gait:    Heel-toe and tandem gait are normal.   Motor Observation:    No asymmetry, no atrophy, and no involuntary movements noted. Tone:    Normal muscle tone.    Posture:    Posture is normal. normal erect    Strength:    Strength is V/V in the upper and lower limbs.      Sensation: intact to LT     Reflex Exam:  DTR's:    Deep tendon reflexes in the upper and lower extremities are normal bilaterally.   Toes:    The toes are downgoing bilaterally.   Clonus:    Clonus is absent.       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  Offer patient MRI of the brain patient wants to hold off at this time. Need recent labs from the patient's primary care, will request. 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 Patient can follow up as necessary Vertigo appears to be peripheral, follow clinically  Sarina Ill, MD  [facility] [address] [place], [place] [address]  Phone [phone] Fax [phone]  

## 2015-01-20 NOTE — Patient Instructions (Signed)
As far as your medications are concerned, I would like to suggest: None at this time  As far as diagnostic testing: EEG  I would like to see you back in as needed, sooner if we need to. Please call us with any interim questions, concerns, problems, updates or refill requests.   Please also call us for any test results so we can go over those with you on the phone.  My clinical assistant and will answer any of your questions and relay your messages to me and also relay most of my messages to you.   Our phone number is [phone]. We also have an after hours call service for urgent matters and there is a physician on-call for urgent questions. For any emergencies you know to call 911 or go to the nearest emergency room

## 2015-01-25 ENCOUNTER — Ambulatory Visit (INDEPENDENT_AMBULATORY_CARE_PROVIDER_SITE_OTHER): Payer: 59 | Admitting: Neurology

## 2015-01-25 ENCOUNTER — Telehealth: Payer: Self-pay | Admitting: *Deleted

## 2015-01-25 ENCOUNTER — Encounter: Payer: Self-pay | Admitting: Neurology

## 2015-01-25 DIAGNOSIS — S04819A Injury of olfactory [1st ] nerve, unspecified side, initial encounter: Secondary | ICD-10-CM

## 2015-01-25 DIAGNOSIS — IMO0001 Reserved for inherently not codable concepts without codable children: Secondary | ICD-10-CM

## 2015-01-25 DIAGNOSIS — R442 Other hallucinations: Secondary | ICD-10-CM | POA: Diagnosis not present

## 2015-01-25 NOTE — Procedures (Signed)
    History:  Micheal Fields is a 54 year old gentleman with a history of multiple concussions. The patient is having anosmia, but he may have olfactory hallucinations. The patient is being evaluated for this issue.  This is a routine EEG. No skull defects are noted. Medications include insulin, lisinopril, Prilosec, Zocor, and Janumet.   EEG classification: Normal awake and asleep  Description of the recording: The background rhythms of this recording consists of a fairly well modulated medium amplitude background activity of 11 Hz. As the record progresses, the patient initially is in the waking state, but appears to enter the early stage II sleep during the recording, with rudimentary sleep spindles and vertex sharp wave activity seen. During the wakeful state, photic stimulation is performed, and this results in a bilateral and symmetric photic driving response. Hyperventilation was also performed, and this results in a minimal buildup of the background rhythm activities without significant slowing seen. At no time during the recording does there appear to be evidence of spike or spike wave discharges or evidence of focal slowing. EKG monitor shows no evidence of cardiac rhythm abnormalities with a heart rate of 60.  Impression: This is a normal EEG recording in the waking and sleeping state. No evidence of ictal or interictal discharges were seen at any time during the recording.

## 2015-01-25 NOTE — Telephone Encounter (Signed)
Spoke with patient about normal EEG results. Told pt to call back if he had anymore questions. Pt verbalized understanding.

## 2015-01-25 NOTE — Progress Notes (Signed)
Please refer to EEG procedure note.

## 2015-07-25 ENCOUNTER — Encounter: Payer: Self-pay | Admitting: Gastroenterology

## 2016-05-10 DIAGNOSIS — Z794 Long term (current) use of insulin: Secondary | ICD-10-CM | POA: Diagnosis not present

## 2016-05-10 DIAGNOSIS — E1149 Type 2 diabetes mellitus with other diabetic neurological complication: Secondary | ICD-10-CM | POA: Diagnosis not present

## 2016-05-10 DIAGNOSIS — K219 Gastro-esophageal reflux disease without esophagitis: Secondary | ICD-10-CM | POA: Diagnosis not present

## 2016-05-10 DIAGNOSIS — G629 Polyneuropathy, unspecified: Secondary | ICD-10-CM | POA: Diagnosis not present

## 2016-05-23 DIAGNOSIS — Z6831 Body mass index (BMI) 31.0-31.9, adult: Secondary | ICD-10-CM | POA: Diagnosis not present

## 2016-05-23 DIAGNOSIS — E1149 Type 2 diabetes mellitus with other diabetic neurological complication: Secondary | ICD-10-CM | POA: Diagnosis not present

## 2016-05-23 DIAGNOSIS — I10 Essential (primary) hypertension: Secondary | ICD-10-CM | POA: Diagnosis not present

## 2016-06-13 DIAGNOSIS — I10 Essential (primary) hypertension: Secondary | ICD-10-CM | POA: Diagnosis not present

## 2016-06-13 DIAGNOSIS — E1149 Type 2 diabetes mellitus with other diabetic neurological complication: Secondary | ICD-10-CM | POA: Diagnosis not present

## 2016-06-13 DIAGNOSIS — Z6832 Body mass index (BMI) 32.0-32.9, adult: Secondary | ICD-10-CM | POA: Diagnosis not present

## 2016-07-25 DIAGNOSIS — I10 Essential (primary) hypertension: Secondary | ICD-10-CM | POA: Diagnosis not present

## 2016-07-25 DIAGNOSIS — Z794 Long term (current) use of insulin: Secondary | ICD-10-CM | POA: Diagnosis not present

## 2016-07-25 DIAGNOSIS — Z23 Encounter for immunization: Secondary | ICD-10-CM | POA: Diagnosis not present

## 2016-07-25 DIAGNOSIS — E1149 Type 2 diabetes mellitus with other diabetic neurological complication: Secondary | ICD-10-CM | POA: Diagnosis not present

## 2016-07-25 DIAGNOSIS — Z6832 Body mass index (BMI) 32.0-32.9, adult: Secondary | ICD-10-CM | POA: Diagnosis not present

## 2016-07-27 DIAGNOSIS — H16103 Unspecified superficial keratitis, bilateral: Secondary | ICD-10-CM | POA: Diagnosis not present

## 2016-07-27 DIAGNOSIS — H18822 Corneal disorder due to contact lens, left eye: Secondary | ICD-10-CM | POA: Diagnosis not present

## 2016-09-13 DIAGNOSIS — Z6827 Body mass index (BMI) 27.0-27.9, adult: Secondary | ICD-10-CM | POA: Diagnosis not present

## 2016-09-13 DIAGNOSIS — Z1389 Encounter for screening for other disorder: Secondary | ICD-10-CM | POA: Diagnosis not present

## 2016-09-13 DIAGNOSIS — J209 Acute bronchitis, unspecified: Secondary | ICD-10-CM | POA: Diagnosis not present

## 2016-09-13 DIAGNOSIS — F419 Anxiety disorder, unspecified: Secondary | ICD-10-CM | POA: Diagnosis not present

## 2016-09-13 DIAGNOSIS — E1149 Type 2 diabetes mellitus with other diabetic neurological complication: Secondary | ICD-10-CM | POA: Diagnosis not present

## 2017-11-07 DIAGNOSIS — M720 Palmar fascial fibromatosis [Dupuytren]: Secondary | ICD-10-CM | POA: Diagnosis not present

## 2017-11-07 DIAGNOSIS — E1149 Type 2 diabetes mellitus with other diabetic neurological complication: Secondary | ICD-10-CM | POA: Diagnosis not present

## 2017-11-07 DIAGNOSIS — E7849 Other hyperlipidemia: Secondary | ICD-10-CM | POA: Diagnosis not present

## 2017-11-07 DIAGNOSIS — I10 Essential (primary) hypertension: Secondary | ICD-10-CM | POA: Diagnosis not present

## 2017-11-07 DIAGNOSIS — Z1389 Encounter for screening for other disorder: Secondary | ICD-10-CM | POA: Diagnosis not present

## 2017-11-18 DIAGNOSIS — M6530 Trigger finger, unspecified finger: Secondary | ICD-10-CM | POA: Insufficient documentation

## 2017-11-18 DIAGNOSIS — M79641 Pain in right hand: Secondary | ICD-10-CM | POA: Insufficient documentation

## 2017-12-20 DIAGNOSIS — E119 Type 2 diabetes mellitus without complications: Secondary | ICD-10-CM | POA: Diagnosis not present

## 2017-12-30 DIAGNOSIS — I10 Essential (primary) hypertension: Secondary | ICD-10-CM | POA: Diagnosis not present

## 2017-12-30 DIAGNOSIS — Z794 Long term (current) use of insulin: Secondary | ICD-10-CM | POA: Diagnosis not present

## 2017-12-30 DIAGNOSIS — E1149 Type 2 diabetes mellitus with other diabetic neurological complication: Secondary | ICD-10-CM | POA: Diagnosis not present

## 2017-12-30 DIAGNOSIS — Z4681 Encounter for fitting and adjustment of insulin pump: Secondary | ICD-10-CM | POA: Diagnosis not present

## 2018-01-06 DIAGNOSIS — M65331 Trigger finger, right middle finger: Secondary | ICD-10-CM | POA: Diagnosis not present

## 2018-01-08 ENCOUNTER — Encounter: Payer: Self-pay | Admitting: Gastroenterology

## 2018-01-14 ENCOUNTER — Encounter: Payer: Self-pay | Admitting: Gastroenterology

## 2018-01-17 DIAGNOSIS — M79641 Pain in right hand: Secondary | ICD-10-CM | POA: Diagnosis not present

## 2018-01-27 DIAGNOSIS — E7849 Other hyperlipidemia: Secondary | ICD-10-CM | POA: Diagnosis not present

## 2018-01-27 DIAGNOSIS — Z0000 Encounter for general adult medical examination without abnormal findings: Secondary | ICD-10-CM | POA: Diagnosis not present

## 2018-01-27 DIAGNOSIS — E1149 Type 2 diabetes mellitus with other diabetic neurological complication: Secondary | ICD-10-CM | POA: Diagnosis not present

## 2018-01-27 DIAGNOSIS — Z125 Encounter for screening for malignant neoplasm of prostate: Secondary | ICD-10-CM | POA: Diagnosis not present

## 2018-01-27 DIAGNOSIS — R82998 Other abnormal findings in urine: Secondary | ICD-10-CM | POA: Diagnosis not present

## 2018-02-07 DIAGNOSIS — Z1389 Encounter for screening for other disorder: Secondary | ICD-10-CM | POA: Diagnosis not present

## 2018-02-07 DIAGNOSIS — E7849 Other hyperlipidemia: Secondary | ICD-10-CM | POA: Diagnosis not present

## 2018-02-07 DIAGNOSIS — E1149 Type 2 diabetes mellitus with other diabetic neurological complication: Secondary | ICD-10-CM | POA: Diagnosis not present

## 2018-02-07 DIAGNOSIS — F419 Anxiety disorder, unspecified: Secondary | ICD-10-CM | POA: Diagnosis not present

## 2018-02-07 DIAGNOSIS — Z0000 Encounter for general adult medical examination without abnormal findings: Secondary | ICD-10-CM | POA: Diagnosis not present

## 2018-02-07 DIAGNOSIS — I10 Essential (primary) hypertension: Secondary | ICD-10-CM | POA: Diagnosis not present

## 2018-02-12 DIAGNOSIS — M79641 Pain in right hand: Secondary | ICD-10-CM | POA: Diagnosis not present

## 2018-02-14 DIAGNOSIS — M79641 Pain in right hand: Secondary | ICD-10-CM | POA: Diagnosis not present

## 2018-02-19 DIAGNOSIS — M79641 Pain in right hand: Secondary | ICD-10-CM | POA: Diagnosis not present

## 2018-02-21 DIAGNOSIS — M79641 Pain in right hand: Secondary | ICD-10-CM | POA: Diagnosis not present

## 2018-03-05 DIAGNOSIS — M79641 Pain in right hand: Secondary | ICD-10-CM | POA: Diagnosis not present

## 2018-03-06 ENCOUNTER — Encounter: Payer: Self-pay | Admitting: *Deleted

## 2018-03-11 DIAGNOSIS — M79641 Pain in right hand: Secondary | ICD-10-CM | POA: Diagnosis not present

## 2018-03-14 DIAGNOSIS — M79641 Pain in right hand: Secondary | ICD-10-CM | POA: Diagnosis not present

## 2018-03-18 DIAGNOSIS — M79641 Pain in right hand: Secondary | ICD-10-CM | POA: Diagnosis not present

## 2018-03-25 ENCOUNTER — Telehealth: Payer: Self-pay | Admitting: *Deleted

## 2018-03-25 ENCOUNTER — Encounter: Payer: Self-pay | Admitting: Gastroenterology

## 2018-03-25 ENCOUNTER — Other Ambulatory Visit: Payer: Self-pay

## 2018-03-25 ENCOUNTER — Ambulatory Visit (AMBULATORY_SURGERY_CENTER): Payer: Self-pay | Admitting: *Deleted

## 2018-03-25 VITALS — Ht 70.5 in | Wt 223.0 lb

## 2018-03-25 DIAGNOSIS — Z800 Family history of malignant neoplasm of digestive organs: Secondary | ICD-10-CM

## 2018-03-25 MED ORDER — PEG 3350-KCL-NA BICARB-NACL 420 G PO SOLR: 4000.0000 mL | Freq: Once | ORAL | 0 refills | Status: AC

## 2018-03-25 NOTE — Telephone Encounter (Signed)
Good Morning Patty-   I just saw Mr Besancon in [place]- he has an insulin pump that is managed by Dr Jacquiline Doe office.  He has a colon scheduled with Dr Ardis Hughs for 04-08-2018.  Can you please get his insulin pump changes.  Thanks,  Lelan Pons

## 2018-03-25 NOTE — Telephone Encounter (Signed)
KARSTON HYLAND 1960-11-02 [id number]  Dear Dr. :Dr Reynaldo Minium   Owens Loffler, MD has scheduled the above patient for a colonoscopy at [facility] on 04/08/18.  Our records show that he/she is on insulin therapy via an insulin pump.  Our colonoscopy prep protocol requires that:  the patient must be on a clear liquid diet the entire day prior to the procedure date as well as the morning of the procedure  the patient must be NPO for 2 hours prior to the procedure   the patient must consume a PEG 3350 solution to prepare for the procedure.  Please advise [place] of any adjustments that need to be made to the patient's insulin pump therapy prior to the above procedure date.    Please route or fax back this completed form to me at [phone] .  If you have any question, please call me at [phone].  Thank you for your help with this matter.  Sincerely,    Koren Shiver, BSN, RN

## 2018-03-25 NOTE — Progress Notes (Signed)
No egg or soy allergy known to patient  No issues with past sedation with any surgeries  or procedures, no intubation problems  No diet pills per patient No home 02 use per patient  No blood thinners per patient  Pt denies issues with constipation  No A fib or A flutter  EMMI video sent to pt's e mail - pt declined  Pt has an insulin pump, will send TE to Koren Shiver  RN to get hold for pt for his colon - pt aware and instructed

## 2018-03-26 DIAGNOSIS — M79641 Pain in right hand: Secondary | ICD-10-CM | POA: Diagnosis not present

## 2018-04-08 ENCOUNTER — Ambulatory Visit (AMBULATORY_SURGERY_CENTER): Payer: BLUE CROSS/BLUE SHIELD | Admitting: Gastroenterology

## 2018-04-08 ENCOUNTER — Encounter: Payer: Self-pay | Admitting: Gastroenterology

## 2018-04-08 VITALS — BP 112/63 | HR 65 | Temp 97.8°F | Resp 11 | Ht 70.5 in | Wt 223.0 lb

## 2018-04-08 DIAGNOSIS — Z800 Family history of malignant neoplasm of digestive organs: Secondary | ICD-10-CM | POA: Diagnosis not present

## 2018-04-08 DIAGNOSIS — Z86010 Personal history of colonic polyps: Secondary | ICD-10-CM | POA: Diagnosis not present

## 2018-04-08 DIAGNOSIS — Z1211 Encounter for screening for malignant neoplasm of colon: Secondary | ICD-10-CM | POA: Diagnosis not present

## 2018-04-08 MED ORDER — SODIUM CHLORIDE 0.9 % IV SOLN: 500.0000 mL | Freq: Once | INTRAVENOUS | Status: DC

## 2018-04-08 NOTE — Progress Notes (Signed)
Report given to PACU, vss 

## 2018-04-08 NOTE — Patient Instructions (Signed)
YOU HAD AN ENDOSCOPIC PROCEDURE TODAY AT [facility]:   Refer to the procedure report that was given to you for any specific questions about what was found during the examination.  If the procedure report does not answer your questions, please call your gastroenterologist to clarify.  If you requested that your care partner not be given the details of your procedure findings, then the procedure report has been included in a sealed envelope for you to review at your convenience later.  YOU SHOULD EXPECT: Some feelings of bloating in the abdomen. Passage of more gas than usual.  Walking can help get rid of the air that was put into your GI tract during the procedure and reduce the bloating. If you had a lower endoscopy (such as a colonoscopy or flexible sigmoidoscopy) you may notice spotting of blood in your stool or on the toilet paper. If you underwent a bowel prep for your procedure, you may not have a normal bowel movement for a few days.  Please Note:  You might notice some irritation and congestion in your nose or some drainage.  This is from the oxygen used during your procedure.  There is no need for concern and it should clear up in a day or so.  SYMPTOMS TO REPORT IMMEDIATELY:   Following lower endoscopy (colonoscopy or flexible sigmoidoscopy):  Excessive amounts of blood in the stool  Significant tenderness or worsening of abdominal pains  Swelling of the abdomen that is new, acute  Fever of 100F or higher   For urgent or emergent issues, a gastroenterologist can be reached at any hour by calling [phone].   DIET:  We do recommend a small meal at first, but then you may proceed to your regular diet.  Drink plenty of fluids but you should avoid alcoholic beverages for 24 hours.  ACTIVITY:  You should plan to take it easy for the rest of today and you should NOT DRIVE or use heavy machinery until tomorrow (because of the sedation medicines used during the test).     FOLLOW UP: Our staff will call the number listed on your records the next business day following your procedure to check on you and address any questions or concerns that you may have regarding the information given to you following your procedure. If we do not reach you, we will leave a message.  However, if you are feeling well and you are not experiencing any problems, there is no need to return our call.  We will assume that you have returned to your regular daily activities without incident.  If any biopsies were taken you will be contacted by phone or by letter within the next 1-3 weeks.  Please call us at [phone] if you have not heard about the biopsies in 3 weeks.    SIGNATURES/CONFIDENTIALITY: You and/or your care partner have signed paperwork which will be entered into your electronic medical record.  These signatures attest to the fact that that the information above on your After Visit Summary has been reviewed and is understood.  Full responsibility of the confidentiality of this discharge information lies with you and/or your care-partner.  We will see you again in 5 years due to your family history.

## 2018-04-08 NOTE — Op Note (Signed)
[facility] Patient Name: Micheal Fields Procedure Date: 04/08/2018 8:39 AM MRN: [id number] Endoscopist: Milus Banister , MD Age: 57 Referring MD:  Date of Birth: 1960-10-16 Gender: Male Account #: [id number] Procedure:                Colonoscopy Indications:              Screening in patient at increased risk: Family                            history of 1st-degree relative with colorectal                            cancer before age 80 years (father had colon cancer                            in his 36s); colonoscopy 2014 single subCM polyp                            (not retrieved), colonoscopy 2009 normal. Medicines:                Monitored Anesthesia Care Procedure:                Pre-Anesthesia Assessment:                           - Prior to the procedure, a History and Physical                            was performed, and patient medications and                            allergies were reviewed. The patient's tolerance of                            previous anesthesia was also reviewed. The risks                            and benefits of the procedure and the sedation                            options and risks were discussed with the patient.                            All questions were answered, and informed consent                            was obtained. Prior Anticoagulants: The patient has                            taken no previous anticoagulant or antiplatelet                            agents. ASA Grade Assessment: II - A patient with  mild systemic disease. After reviewing the risks                            and benefits, the patient was deemed in                            satisfactory condition to undergo the procedure.                           After obtaining informed consent, the colonoscope                            was passed under direct vision. Throughout the                            procedure, the patient's blood  pressure, pulse, and                            oxygen saturations were monitored continuously. The                            Colonoscope was introduced through the anus and                            advanced to the the cecum, identified by                            appendiceal orifice and ileocecal valve. The                            colonoscopy was performed without difficulty. The                            patient tolerated the procedure well. The quality                            of the bowel preparation was good. The ileocecal                            valve, appendiceal orifice, and rectum were                            photographed. Scope In: 8:43:17 AM Scope Out: 8:51:40 AM Scope Withdrawal Time: 0 hours 6 minutes 37 seconds  Total Procedure Duration: 0 hours 8 minutes 23 seconds  Findings:                 The entire examined colon appeared normal on direct                            and retroflexion views. Complications:            No immediate complications. Estimated blood loss:  None. Estimated Blood Loss:     Estimated blood loss: none. Impression:               - The entire examined colon is normal on direct and                            retroflexion views.                           - No polyps or cancers. Recommendation:           - Patient has a contact number available for                            emergencies. The signs and symptoms of potential                            delayed complications were discussed with the                            patient. Return to normal activities tomorrow.                            Written discharge instructions were provided to the                            patient.                           - Resume previous diet.                           - Continue present medications.                           - Repeat colonoscopy in 5 years for screening                            purposes. Milus Banister, MD 04/08/2018 8:54:40 AM This report has been signed electronically.

## 2018-04-09 ENCOUNTER — Telehealth: Payer: Self-pay

## 2018-04-09 ENCOUNTER — Telehealth: Payer: Self-pay | Admitting: *Deleted

## 2018-04-09 DIAGNOSIS — E119 Type 2 diabetes mellitus without complications: Secondary | ICD-10-CM | POA: Diagnosis not present

## 2018-04-09 NOTE — Telephone Encounter (Signed)
  Follow up Call-  Call back number 04/08/2018  Post procedure Call Back phone  # [phone]  Permission to leave phone message Yes  Some recent data might be hidden     Patient questions:  Do you have a fever, pain , or abdominal swelling? No. Pain Score  0 *  Have you tolerated food without any problems? No.  Have you been able to return to your normal activities? No.  Do you have any questions about your discharge instructions: Diet   No. Medications  No. Follow up visit  No.  Do you have questions or concerns about your Care? No.  Actions: * If pain score is 4 or above: No action needed, pain <4.

## 2018-04-09 NOTE — Telephone Encounter (Signed)
NO ANSWER, MESSAGE LEFT FOR PATIENT. 

## 2018-06-04 DIAGNOSIS — E7849 Other hyperlipidemia: Secondary | ICD-10-CM | POA: Diagnosis not present

## 2018-06-04 DIAGNOSIS — E1149 Type 2 diabetes mellitus with other diabetic neurological complication: Secondary | ICD-10-CM | POA: Diagnosis not present

## 2018-06-04 DIAGNOSIS — Z23 Encounter for immunization: Secondary | ICD-10-CM | POA: Diagnosis not present

## 2018-06-04 DIAGNOSIS — Z794 Long term (current) use of insulin: Secondary | ICD-10-CM | POA: Diagnosis not present

## 2018-06-04 DIAGNOSIS — I10 Essential (primary) hypertension: Secondary | ICD-10-CM | POA: Diagnosis not present

## 2018-07-08 DIAGNOSIS — E119 Type 2 diabetes mellitus without complications: Secondary | ICD-10-CM | POA: Diagnosis not present

## 2018-10-01 DIAGNOSIS — H52203 Unspecified astigmatism, bilateral: Secondary | ICD-10-CM | POA: Diagnosis not present

## 2018-10-01 DIAGNOSIS — H5203 Hypermetropia, bilateral: Secondary | ICD-10-CM | POA: Diagnosis not present

## 2018-10-01 DIAGNOSIS — E119 Type 2 diabetes mellitus without complications: Secondary | ICD-10-CM | POA: Diagnosis not present

## 2018-10-13 DIAGNOSIS — E7849 Other hyperlipidemia: Secondary | ICD-10-CM | POA: Diagnosis not present

## 2018-10-13 DIAGNOSIS — E1149 Type 2 diabetes mellitus with other diabetic neurological complication: Secondary | ICD-10-CM | POA: Diagnosis not present

## 2018-10-13 DIAGNOSIS — I10 Essential (primary) hypertension: Secondary | ICD-10-CM | POA: Diagnosis not present

## 2018-10-13 DIAGNOSIS — Z794 Long term (current) use of insulin: Secondary | ICD-10-CM | POA: Diagnosis not present

## 2019-01-13 DIAGNOSIS — E119 Type 2 diabetes mellitus without complications: Secondary | ICD-10-CM | POA: Diagnosis not present

## 2019-02-12 DIAGNOSIS — H16221 Keratoconjunctivitis sicca, not specified as Sjogren's, right eye: Secondary | ICD-10-CM | POA: Diagnosis not present

## 2019-02-16 DIAGNOSIS — Z0000 Encounter for general adult medical examination without abnormal findings: Secondary | ICD-10-CM | POA: Diagnosis not present

## 2019-02-16 DIAGNOSIS — E1149 Type 2 diabetes mellitus with other diabetic neurological complication: Secondary | ICD-10-CM | POA: Diagnosis not present

## 2019-02-16 DIAGNOSIS — Z125 Encounter for screening for malignant neoplasm of prostate: Secondary | ICD-10-CM | POA: Diagnosis not present

## 2019-02-17 DIAGNOSIS — R82998 Other abnormal findings in urine: Secondary | ICD-10-CM | POA: Diagnosis not present

## 2019-02-17 DIAGNOSIS — I10 Essential (primary) hypertension: Secondary | ICD-10-CM | POA: Diagnosis not present

## 2019-03-02 DIAGNOSIS — E1149 Type 2 diabetes mellitus with other diabetic neurological complication: Secondary | ICD-10-CM | POA: Diagnosis not present

## 2019-03-02 DIAGNOSIS — Z0000 Encounter for general adult medical examination without abnormal findings: Secondary | ICD-10-CM | POA: Diagnosis not present

## 2019-03-02 DIAGNOSIS — I10 Essential (primary) hypertension: Secondary | ICD-10-CM | POA: Diagnosis not present

## 2019-03-02 DIAGNOSIS — K219 Gastro-esophageal reflux disease without esophagitis: Secondary | ICD-10-CM | POA: Diagnosis not present

## 2019-03-02 DIAGNOSIS — G629 Polyneuropathy, unspecified: Secondary | ICD-10-CM | POA: Diagnosis not present

## 2019-04-03 ENCOUNTER — Other Ambulatory Visit: Payer: Self-pay

## 2019-04-03 DIAGNOSIS — Z20822 Contact with and (suspected) exposure to covid-19: Secondary | ICD-10-CM

## 2019-04-03 DIAGNOSIS — R6889 Other general symptoms and signs: Secondary | ICD-10-CM | POA: Diagnosis not present

## 2019-04-06 LAB — NOVEL CORONAVIRUS, NAA: SARS-CoV-2, NAA: NOT DETECTED

## 2019-06-24 DIAGNOSIS — Z794 Long term (current) use of insulin: Secondary | ICD-10-CM | POA: Diagnosis not present

## 2019-07-07 DIAGNOSIS — Z23 Encounter for immunization: Secondary | ICD-10-CM | POA: Diagnosis not present

## 2019-07-07 DIAGNOSIS — E1149 Type 2 diabetes mellitus with other diabetic neurological complication: Secondary | ICD-10-CM | POA: Diagnosis not present

## 2019-07-09 DIAGNOSIS — E1149 Type 2 diabetes mellitus with other diabetic neurological complication: Secondary | ICD-10-CM | POA: Diagnosis not present

## 2019-07-09 DIAGNOSIS — G629 Polyneuropathy, unspecified: Secondary | ICD-10-CM | POA: Diagnosis not present

## 2019-07-09 DIAGNOSIS — I10 Essential (primary) hypertension: Secondary | ICD-10-CM | POA: Diagnosis not present

## 2019-07-12 DIAGNOSIS — Z20828 Contact with and (suspected) exposure to other viral communicable diseases: Secondary | ICD-10-CM | POA: Diagnosis not present

## 2019-07-27 ENCOUNTER — Other Ambulatory Visit: Payer: Self-pay

## 2019-07-27 DIAGNOSIS — Z20822 Contact with and (suspected) exposure to covid-19: Secondary | ICD-10-CM

## 2019-07-29 LAB — NOVEL CORONAVIRUS, NAA: SARS-CoV-2, NAA: NOT DETECTED

## 2019-08-15 DIAGNOSIS — Z794 Long term (current) use of insulin: Secondary | ICD-10-CM | POA: Diagnosis not present

## 2019-08-15 DIAGNOSIS — E1149 Type 2 diabetes mellitus with other diabetic neurological complication: Secondary | ICD-10-CM | POA: Diagnosis not present

## 2019-10-26 DIAGNOSIS — E1149 Type 2 diabetes mellitus with other diabetic neurological complication: Secondary | ICD-10-CM | POA: Diagnosis not present

## 2019-10-28 DIAGNOSIS — Z1331 Encounter for screening for depression: Secondary | ICD-10-CM | POA: Diagnosis not present

## 2019-10-28 DIAGNOSIS — E785 Hyperlipidemia, unspecified: Secondary | ICD-10-CM | POA: Diagnosis not present

## 2019-10-28 DIAGNOSIS — I10 Essential (primary) hypertension: Secondary | ICD-10-CM | POA: Diagnosis not present

## 2019-10-28 DIAGNOSIS — E1149 Type 2 diabetes mellitus with other diabetic neurological complication: Secondary | ICD-10-CM | POA: Diagnosis not present

## 2019-10-28 DIAGNOSIS — Z794 Long term (current) use of insulin: Secondary | ICD-10-CM | POA: Diagnosis not present

## 2019-11-25 DIAGNOSIS — L821 Other seborrheic keratosis: Secondary | ICD-10-CM | POA: Diagnosis not present

## 2019-11-25 DIAGNOSIS — L218 Other seborrheic dermatitis: Secondary | ICD-10-CM | POA: Diagnosis not present

## 2019-11-25 DIAGNOSIS — D225 Melanocytic nevi of trunk: Secondary | ICD-10-CM | POA: Diagnosis not present

## 2019-11-25 DIAGNOSIS — M720 Palmar fascial fibromatosis [Dupuytren]: Secondary | ICD-10-CM | POA: Diagnosis not present

## 2019-12-09 DIAGNOSIS — Z23 Encounter for immunization: Secondary | ICD-10-CM | POA: Diagnosis not present

## 2020-01-06 DIAGNOSIS — Z23 Encounter for immunization: Secondary | ICD-10-CM | POA: Diagnosis not present

## 2020-01-22 DIAGNOSIS — Z794 Long term (current) use of insulin: Secondary | ICD-10-CM | POA: Diagnosis not present

## 2020-01-22 DIAGNOSIS — E1149 Type 2 diabetes mellitus with other diabetic neurological complication: Secondary | ICD-10-CM | POA: Diagnosis not present

## 2020-02-24 DIAGNOSIS — Z794 Long term (current) use of insulin: Secondary | ICD-10-CM | POA: Diagnosis not present

## 2020-02-24 DIAGNOSIS — E1149 Type 2 diabetes mellitus with other diabetic neurological complication: Secondary | ICD-10-CM | POA: Diagnosis not present

## 2020-02-24 DIAGNOSIS — I10 Essential (primary) hypertension: Secondary | ICD-10-CM | POA: Diagnosis not present

## 2020-02-24 DIAGNOSIS — Z4681 Encounter for fitting and adjustment of insulin pump: Secondary | ICD-10-CM | POA: Diagnosis not present

## 2020-04-13 DIAGNOSIS — E1149 Type 2 diabetes mellitus with other diabetic neurological complication: Secondary | ICD-10-CM | POA: Diagnosis not present

## 2020-06-21 DIAGNOSIS — Z794 Long term (current) use of insulin: Secondary | ICD-10-CM | POA: Diagnosis not present

## 2020-06-21 DIAGNOSIS — E1149 Type 2 diabetes mellitus with other diabetic neurological complication: Secondary | ICD-10-CM | POA: Diagnosis not present

## 2020-08-01 DIAGNOSIS — J029 Acute pharyngitis, unspecified: Secondary | ICD-10-CM | POA: Diagnosis not present

## 2020-08-07 DIAGNOSIS — R5383 Other fatigue: Secondary | ICD-10-CM | POA: Diagnosis not present

## 2020-08-07 DIAGNOSIS — J029 Acute pharyngitis, unspecified: Secondary | ICD-10-CM | POA: Diagnosis not present

## 2020-08-07 DIAGNOSIS — R52 Pain, unspecified: Secondary | ICD-10-CM | POA: Diagnosis not present

## 2020-08-07 DIAGNOSIS — R059 Cough, unspecified: Secondary | ICD-10-CM | POA: Diagnosis not present

## 2020-08-07 DIAGNOSIS — Z20822 Contact with and (suspected) exposure to covid-19: Secondary | ICD-10-CM | POA: Diagnosis not present

## 2020-08-09 DIAGNOSIS — Z125 Encounter for screening for malignant neoplasm of prostate: Secondary | ICD-10-CM | POA: Diagnosis not present

## 2020-08-09 DIAGNOSIS — E1149 Type 2 diabetes mellitus with other diabetic neurological complication: Secondary | ICD-10-CM | POA: Diagnosis not present

## 2020-08-09 DIAGNOSIS — E785 Hyperlipidemia, unspecified: Secondary | ICD-10-CM | POA: Diagnosis not present

## 2020-08-10 DIAGNOSIS — R82998 Other abnormal findings in urine: Secondary | ICD-10-CM | POA: Diagnosis not present

## 2020-08-10 DIAGNOSIS — I10 Essential (primary) hypertension: Secondary | ICD-10-CM | POA: Diagnosis not present

## 2020-08-10 DIAGNOSIS — K921 Melena: Secondary | ICD-10-CM | POA: Diagnosis not present

## 2020-08-15 DIAGNOSIS — E1149 Type 2 diabetes mellitus with other diabetic neurological complication: Secondary | ICD-10-CM | POA: Diagnosis not present

## 2020-08-15 DIAGNOSIS — Z0000 Encounter for general adult medical examination without abnormal findings: Secondary | ICD-10-CM | POA: Diagnosis not present

## 2020-08-15 DIAGNOSIS — I10 Essential (primary) hypertension: Secondary | ICD-10-CM | POA: Diagnosis not present

## 2020-08-15 DIAGNOSIS — Z23 Encounter for immunization: Secondary | ICD-10-CM | POA: Diagnosis not present

## 2020-08-22 DIAGNOSIS — C44622 Squamous cell carcinoma of skin of right upper limb, including shoulder: Secondary | ICD-10-CM | POA: Diagnosis not present

## 2020-09-01 DIAGNOSIS — Z1212 Encounter for screening for malignant neoplasm of rectum: Secondary | ICD-10-CM | POA: Diagnosis not present

## 2020-09-10 DIAGNOSIS — H269 Unspecified cataract: Secondary | ICD-10-CM

## 2020-09-10 HISTORY — DX: Unspecified cataract: H26.9

## 2020-10-03 DIAGNOSIS — E119 Type 2 diabetes mellitus without complications: Secondary | ICD-10-CM | POA: Diagnosis not present

## 2020-10-03 DIAGNOSIS — H4322 Crystalline deposits in vitreous body, left eye: Secondary | ICD-10-CM | POA: Diagnosis not present

## 2020-10-03 DIAGNOSIS — H524 Presbyopia: Secondary | ICD-10-CM | POA: Diagnosis not present

## 2020-10-03 DIAGNOSIS — H25043 Posterior subcapsular polar age-related cataract, bilateral: Secondary | ICD-10-CM | POA: Diagnosis not present

## 2020-11-02 ENCOUNTER — Emergency Department (HOSPITAL_COMMUNITY)
Admission: EM | Admit: 2020-11-02 | Discharge: 2020-11-02 | Disposition: A | Discharge status: Home / Self Care | Payer: BC Managed Care – PPO | Attending: Emergency Medicine | Admitting: Emergency Medicine

## 2020-11-02 ENCOUNTER — Encounter (HOSPITAL_COMMUNITY): Payer: Self-pay | Admitting: Emergency Medicine

## 2020-11-02 ENCOUNTER — Emergency Department (HOSPITAL_COMMUNITY): Payer: BC Managed Care – PPO

## 2020-11-02 DIAGNOSIS — Z79899 Other long term (current) drug therapy: Secondary | ICD-10-CM | POA: Insufficient documentation

## 2020-11-02 DIAGNOSIS — E1140 Type 2 diabetes mellitus with diabetic neuropathy, unspecified: Secondary | ICD-10-CM | POA: Insufficient documentation

## 2020-11-02 DIAGNOSIS — Z794 Long term (current) use of insulin: Secondary | ICD-10-CM | POA: Diagnosis not present

## 2020-11-02 DIAGNOSIS — Z7984 Long term (current) use of oral hypoglycemic drugs: Secondary | ICD-10-CM | POA: Insufficient documentation

## 2020-11-02 DIAGNOSIS — S0990XA Unspecified injury of head, initial encounter: Secondary | ICD-10-CM

## 2020-11-02 DIAGNOSIS — S0101XA Laceration without foreign body of scalp, initial encounter: Secondary | ICD-10-CM | POA: Diagnosis not present

## 2020-11-02 DIAGNOSIS — F10929 Alcohol use, unspecified with intoxication, unspecified: Secondary | ICD-10-CM | POA: Diagnosis not present

## 2020-11-02 DIAGNOSIS — I10 Essential (primary) hypertension: Secondary | ICD-10-CM | POA: Insufficient documentation

## 2020-11-02 DIAGNOSIS — R220 Localized swelling, mass and lump, head: Secondary | ICD-10-CM | POA: Diagnosis not present

## 2020-11-02 DIAGNOSIS — R404 Transient alteration of awareness: Secondary | ICD-10-CM | POA: Diagnosis not present

## 2020-11-02 DIAGNOSIS — W01198A Fall on same level from slipping, tripping and stumbling with subsequent striking against other object, initial encounter: Secondary | ICD-10-CM | POA: Diagnosis not present

## 2020-11-02 DIAGNOSIS — Z043 Encounter for examination and observation following other accident: Secondary | ICD-10-CM | POA: Diagnosis not present

## 2020-11-02 DIAGNOSIS — W19XXXA Unspecified fall, initial encounter: Secondary | ICD-10-CM

## 2020-11-02 LAB — CBG MONITORING, ED: Glucose-Capillary: 177 mg/dL — ABNORMAL HIGH (ref 70–99)

## 2020-11-02 NOTE — ED Notes (Signed)
Pt's wife verbalizes understanding of d/c instructions, wound care and follow up. She understands to monitor for reoccurring falls as well as complications of alcohol intoxication. Pt to vehicle via WC. Awake and alert

## 2020-11-02 NOTE — ED Notes (Signed)
Pt continues to be argumentative with this RN and wife. Pt assisted to standing to use urinal. Pt swaying, assisted back into bed

## 2020-11-02 NOTE — ED Notes (Signed)
Pt to CT via stretcher

## 2020-11-02 NOTE — ED Notes (Signed)
Pt continues to be resistive to care measures to myself and MD at bedside.

## 2020-11-02 NOTE — ED Notes (Signed)
Pt removing monitoring devices, mask broken and discarded on floor by patient

## 2020-11-02 NOTE — ED Triage Notes (Signed)
Pt transported from home by [facility], pt fell per wife after tripping over garden hose and striking head on brick wall.  Pt resistive to care with EMS. [employer] accompanied pt to ED.

## 2020-11-02 NOTE — ED Notes (Signed)
Dr. Dayna Barker at bedside speaking with pts wife

## 2020-11-02 NOTE — ED Notes (Signed)
Pts wife at bedside, pt attempting to get out of bed, slurred speech.

## 2020-11-06 NOTE — ED Provider Notes (Signed)
[facility] EMERGENCY DEPARTMENT Provider Note   CSN: [id number] Arrival date & time: 11/02/20  0038     History Chief Complaint  Patient presents with  . Fall    Micheal Fields. is a 60 y.o. male.  Intoxicated. Initially refusing all interventions but wife convinced him to be observed and get a ct scan. Fell backwards after tripping on a hose. Has a wound to back of head. No other pain.    Fall This is a new problem. The current episode started 1 to 2 hours ago. The problem occurs constantly. The problem has not changed since onset.Associated symptoms include headaches. Pertinent negatives include no chest pain, no abdominal pain and no shortness of breath. Nothing aggravates the symptoms. Nothing relieves the symptoms. He has tried nothing for the symptoms.       Past Medical History:  Diagnosis Date  . Allergy    mild   . Arthritis   . Diabetes mellitus without complication ([place])   . GERD (gastroesophageal reflux disease)   . Hyperlipidemia    takes meds due to DM- no actual high cholesterol   . Hypertension    takes meds due to DM- no actual high BP   . 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 Benign paroxysmal positional vertigo 01/20/2015    Past Surgical History:  Procedure Laterality Date  . COLONOSCOPY    . ELBOW SURGERY     left elbow  . LAMINECTOMY     l 4-5  . TRIGGER FINGER RELEASE Right    middle finger  . UPPER GASTROINTESTINAL ENDOSCOPY    . WISDOM TOOTH EXTRACTION         Family History  Problem Relation Age of Onset  . Diabetes Mother   . Colon cancer Father 76  . Colon polyps Neg Hx   . Esophageal cancer Neg Hx   . Rectal cancer Neg Hx   . Stomach cancer Neg Hx     Social History   Tobacco Use  . Smoking status: Never Smoker  . Smokeless tobacco: Former Systems developer    Types: Chew  . Tobacco comment:  occasional  Substance Use Topics  . Alcohol use: Yes    Alcohol/week: 6.0 standard drinks    Types: 6 Cans of beer per week    Comment: weekly but not daily - weekends   . Drug use: No    Home Medications Prior to Admission medications   Medication Sig Start Date End Date Taking? Authorizing Provider  Ascorbic Acid (VITAMIN C) 1000 MG tablet Vitamin C    [provider]  buPROPion (WELLBUTRIN XL) 150 MG 24 hr tablet TK 1 T PO D 03/02/18   [provider]  Continuous Blood Gluc Receiver (FREESTYLE LIBRE 14 DAY READER) DEVI FreeStyle Libre 14 Day Reader    [provider]  Continuous Blood Gluc Sensor (FREESTYLE LIBRE 14 DAY SENSOR) MISC U UTD Q 14 DAYS TO MONITOR BLOOD GLUCOSE CONTINUOUSLY. 03/02/18   [provider]  fluticasone (FLONASE) 50 MCG/ACT nasal spray fluticasone propionate 50 mcg/actuation nasal spray,suspension  INSTILL 2 SPRAYS INTO EACH NOSTRIL DAILY FOR 7 DAYS, THEN 1 SPRAY DAILY    [provider]  glucose blood (ONETOUCH VERIO) test strip TEST 4 TIMES DAILY AS DIRECTED 07/02/16   [provider]  Insulin Disposable Pump (V-GO 30) KIT  USE 1 DEVICE DAILY - 30 UNITS BASAL, UP TO 36 UNITS BOLUS PER DAY 08/15/16   [provider]  JARDIANCE 25 MG TABS tablet TK 1 T PO D 03/02/18   [provider]  lisinopril (PRINIVIL,ZESTRIL) 20 MG tablet Take 20 mg by mouth daily.    [provider]  metFORMIN (GLUCOPHAGE) 500 MG tablet TK 1 T PO BID 03/10/18   [provider]  NOVOLOG 100 UNIT/ML injection U IN INSULIN PUMP UP TO 100 UNITS PER DAY 03/10/18   [provider]  omeprazole (PRILOSEC) 20 MG capsule Take 20 mg by mouth daily.    [provider]  ONE TOUCH ULTRA TEST test strip  12/15/14   [provider]  simvastatin (ZOCOR) 40 MG tablet Take 40 mg by mouth daily.     [provider]  sitaGLIPtin-metformin (JANUMET) 50-1000 MG per tablet Take 1 tablet by mouth 2  (two) times daily with a meal.    [provider]    Allergies    Sulfa antibiotics  Review of Systems   Review of Systems  Respiratory: Negative for shortness of breath.   Cardiovascular: Negative for chest pain.  Gastrointestinal: Negative for abdominal pain.  Neurological: Positive for headaches.  All other systems reviewed and are negative.   Physical Exam Updated Vital Signs BP 120/69 (BP Location: Right Arm)   Pulse 95   Temp (!) 97 F (36.1 C) (Temporal)   Resp 20   Ht _0  (1.778 m)   SpO2 94%   BMI 32.00 kg/m   Physical Exam Vitals and nursing note reviewed.  Constitutional:      Appearance: He is well-developed and well-nourished.  HENT:     Head: Normocephalic.     Comments: Well approximated, hemostatic 3 cm laceration to posterior scalp    Nose: Nose normal. No congestion or rhinorrhea.     Mouth/Throat:     Mouth: Mucous membranes are moist.     Pharynx: Oropharynx is clear.  Eyes:     Pupils: Pupils are equal, round, and reactive to light.  Cardiovascular:     Rate and Rhythm: Normal rate.  Pulmonary:     Effort: Pulmonary effort is normal. No respiratory distress.  Abdominal:     General: Abdomen is flat. There is no distension.  Musculoskeletal:        General: Normal range of motion.     Cervical back: Normal range of motion.  Skin:    General: Skin is warm and dry.  Neurological:     General: No focal deficit present.     Mental Status: He is alert.     ED Results / Procedures / Treatments   Labs (all labs ordered are listed, but only abnormal results are displayed) Labs Reviewed  CBG MONITORING, ED - Abnormal; Notable for the following components:      Result Value   Glucose-Capillary 177 (*)    All other components within normal limits    EKG EKG Interpretation  Date/Time:  Wednesday November 02 2020 00:47:55 EST Ventricular Rate:  95 PR Interval:    QRS Duration: 90 QT Interval:  367 QTC Calculation: 452 R  Axis:   76 Text Interpretation: Sinus rhythm Borderline short PR interval Confirmed by Merrily Pew [phone]) on 11/02/2020 2:07:31 AM Also confirmed by Merrily Pew [phone]), editor Oswaldo Milian, Beverly ([id number])  on 11/03/2020 11:39:35 AM   Radiology No results found.  Procedures Procedures   Medications Ordered in ED Medications -  No data to display  ED Course  I have reviewed the triage vital signs and the nursing notes.  Pertinent labs & imaging results that were available during my care of the patient were reviewed by me and considered in my medical decision making (see chart for details).    MDM Rules/Calculators/A&P                          CT is unremarkable.  I could put some stitches or staples in the laceration but the patient refuses.  It is well approximated and hemostatic.  I do not feel like it is absolutely necessary and thus will force the issue.  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  Discussed with his wife who will watch closely overnight.  Discussed return precautions  Final Clinical Impression(s) / ED Diagnoses Final diagnoses:  Fall, initial encounter  Injury of head, initial encounter  Laceration of scalp, initial encounter    Rx / DC Orders ED Discharge Orders    None       Farryn Linares, Corene Cornea, MD 11/06/20 [phone]

## 2020-11-17 DIAGNOSIS — H25041 Posterior subcapsular polar age-related cataract, right eye: Secondary | ICD-10-CM | POA: Diagnosis not present

## 2020-11-17 DIAGNOSIS — H25811 Combined forms of age-related cataract, right eye: Secondary | ICD-10-CM | POA: Diagnosis not present

## 2020-11-17 DIAGNOSIS — H52221 Regular astigmatism, right eye: Secondary | ICD-10-CM | POA: Diagnosis not present

## 2020-11-17 DIAGNOSIS — H2511 Age-related nuclear cataract, right eye: Secondary | ICD-10-CM | POA: Diagnosis not present

## 2020-11-17 DIAGNOSIS — H25011 Cortical age-related cataract, right eye: Secondary | ICD-10-CM | POA: Diagnosis not present

## 2020-11-24 DIAGNOSIS — D225 Melanocytic nevi of trunk: Secondary | ICD-10-CM | POA: Diagnosis not present

## 2020-11-24 DIAGNOSIS — D2261 Melanocytic nevi of right upper limb, including shoulder: Secondary | ICD-10-CM | POA: Diagnosis not present

## 2020-11-24 DIAGNOSIS — D2262 Melanocytic nevi of left upper limb, including shoulder: Secondary | ICD-10-CM | POA: Diagnosis not present

## 2020-11-24 DIAGNOSIS — C44719 Basal cell carcinoma of skin of left lower limb, including hip: Secondary | ICD-10-CM | POA: Diagnosis not present

## 2020-11-24 DIAGNOSIS — L821 Other seborrheic keratosis: Secondary | ICD-10-CM | POA: Diagnosis not present

## 2020-11-24 DIAGNOSIS — C44712 Basal cell carcinoma of skin of right lower limb, including hip: Secondary | ICD-10-CM | POA: Diagnosis not present

## 2020-12-08 DIAGNOSIS — H2512 Age-related nuclear cataract, left eye: Secondary | ICD-10-CM | POA: Diagnosis not present

## 2020-12-08 DIAGNOSIS — H52222 Regular astigmatism, left eye: Secondary | ICD-10-CM | POA: Diagnosis not present

## 2020-12-08 DIAGNOSIS — H25812 Combined forms of age-related cataract, left eye: Secondary | ICD-10-CM | POA: Diagnosis not present

## 2020-12-08 DIAGNOSIS — H25042 Posterior subcapsular polar age-related cataract, left eye: Secondary | ICD-10-CM | POA: Diagnosis not present

## 2021-01-16 DIAGNOSIS — Z794 Long term (current) use of insulin: Secondary | ICD-10-CM | POA: Diagnosis not present

## 2021-01-16 DIAGNOSIS — E1149 Type 2 diabetes mellitus with other diabetic neurological complication: Secondary | ICD-10-CM | POA: Diagnosis not present

## 2021-02-13 DIAGNOSIS — E1149 Type 2 diabetes mellitus with other diabetic neurological complication: Secondary | ICD-10-CM | POA: Diagnosis not present

## 2021-02-13 DIAGNOSIS — I10 Essential (primary) hypertension: Secondary | ICD-10-CM | POA: Diagnosis not present

## 2021-03-01 DIAGNOSIS — H903 Sensorineural hearing loss, bilateral: Secondary | ICD-10-CM | POA: Diagnosis not present

## 2021-03-14 DIAGNOSIS — T1512XA Foreign body in conjunctival sac, left eye, initial encounter: Secondary | ICD-10-CM | POA: Diagnosis not present

## 2021-05-11 DIAGNOSIS — L820 Inflamed seborrheic keratosis: Secondary | ICD-10-CM | POA: Diagnosis not present

## 2021-06-23 DIAGNOSIS — R0789 Other chest pain: Secondary | ICD-10-CM | POA: Diagnosis not present

## 2021-06-23 DIAGNOSIS — E1149 Type 2 diabetes mellitus with other diabetic neurological complication: Secondary | ICD-10-CM | POA: Diagnosis not present

## 2021-06-23 DIAGNOSIS — R052 Subacute cough: Secondary | ICD-10-CM | POA: Diagnosis not present

## 2021-07-03 DIAGNOSIS — Z794 Long term (current) use of insulin: Secondary | ICD-10-CM | POA: Diagnosis not present

## 2021-07-03 DIAGNOSIS — E1149 Type 2 diabetes mellitus with other diabetic neurological complication: Secondary | ICD-10-CM | POA: Diagnosis not present

## 2021-08-18 DIAGNOSIS — E1149 Type 2 diabetes mellitus with other diabetic neurological complication: Secondary | ICD-10-CM | POA: Diagnosis not present

## 2021-08-18 DIAGNOSIS — Z125 Encounter for screening for malignant neoplasm of prostate: Secondary | ICD-10-CM | POA: Diagnosis not present

## 2021-08-23 DIAGNOSIS — Z1331 Encounter for screening for depression: Secondary | ICD-10-CM | POA: Diagnosis not present

## 2021-08-23 DIAGNOSIS — Z23 Encounter for immunization: Secondary | ICD-10-CM | POA: Diagnosis not present

## 2021-08-23 DIAGNOSIS — Z0000 Encounter for general adult medical examination without abnormal findings: Secondary | ICD-10-CM | POA: Diagnosis not present

## 2021-08-23 DIAGNOSIS — I10 Essential (primary) hypertension: Secondary | ICD-10-CM | POA: Diagnosis not present

## 2021-08-23 DIAGNOSIS — Z1339 Encounter for screening examination for other mental health and behavioral disorders: Secondary | ICD-10-CM | POA: Diagnosis not present

## 2021-10-25 DIAGNOSIS — E1149 Type 2 diabetes mellitus with other diabetic neurological complication: Secondary | ICD-10-CM | POA: Diagnosis not present

## 2021-10-25 DIAGNOSIS — Z794 Long term (current) use of insulin: Secondary | ICD-10-CM | POA: Diagnosis not present

## 2021-11-27 DIAGNOSIS — L821 Other seborrheic keratosis: Secondary | ICD-10-CM | POA: Diagnosis not present

## 2021-11-27 DIAGNOSIS — L820 Inflamed seborrheic keratosis: Secondary | ICD-10-CM | POA: Diagnosis not present

## 2021-11-27 DIAGNOSIS — L570 Actinic keratosis: Secondary | ICD-10-CM | POA: Diagnosis not present

## 2021-11-27 DIAGNOSIS — D225 Melanocytic nevi of trunk: Secondary | ICD-10-CM | POA: Diagnosis not present

## 2021-11-27 DIAGNOSIS — Z85828 Personal history of other malignant neoplasm of skin: Secondary | ICD-10-CM | POA: Diagnosis not present

## 2022-01-01 DIAGNOSIS — M79642 Pain in left hand: Secondary | ICD-10-CM | POA: Diagnosis not present

## 2022-01-01 DIAGNOSIS — M79641 Pain in right hand: Secondary | ICD-10-CM | POA: Diagnosis not present

## 2022-01-01 DIAGNOSIS — M720 Palmar fascial fibromatosis [Dupuytren]: Secondary | ICD-10-CM | POA: Diagnosis not present

## 2022-01-10 DIAGNOSIS — I10 Essential (primary) hypertension: Secondary | ICD-10-CM | POA: Diagnosis not present

## 2022-01-11 ENCOUNTER — Ambulatory Visit
Admission: RE | Admit: 2022-01-11 | Discharge: 2022-01-11 | Disposition: A | Discharge status: Home / Self Care | Payer: BC Managed Care – PPO | Source: Ambulatory Visit | Attending: Registered Nurse | Admitting: Registered Nurse

## 2022-01-11 ENCOUNTER — Other Ambulatory Visit: Payer: Self-pay | Admitting: Registered Nurse

## 2022-01-11 DIAGNOSIS — S0510XA Contusion of eyeball and orbital tissues, unspecified eye, initial encounter: Secondary | ICD-10-CM

## 2022-01-11 DIAGNOSIS — S0031XA Abrasion of nose, initial encounter: Secondary | ICD-10-CM

## 2022-03-07 DIAGNOSIS — E1149 Type 2 diabetes mellitus with other diabetic neurological complication: Secondary | ICD-10-CM | POA: Diagnosis not present

## 2022-04-18 DIAGNOSIS — E1149 Type 2 diabetes mellitus with other diabetic neurological complication: Secondary | ICD-10-CM | POA: Diagnosis not present

## 2022-04-18 DIAGNOSIS — Z794 Long term (current) use of insulin: Secondary | ICD-10-CM | POA: Diagnosis not present

## 2022-05-29 DIAGNOSIS — Z794 Long term (current) use of insulin: Secondary | ICD-10-CM | POA: Diagnosis not present

## 2022-05-29 DIAGNOSIS — E1149 Type 2 diabetes mellitus with other diabetic neurological complication: Secondary | ICD-10-CM | POA: Diagnosis not present

## 2022-06-11 DIAGNOSIS — M720 Palmar fascial fibromatosis [Dupuytren]: Secondary | ICD-10-CM | POA: Diagnosis not present

## 2022-06-13 DIAGNOSIS — M720 Palmar fascial fibromatosis [Dupuytren]: Secondary | ICD-10-CM | POA: Diagnosis not present

## 2022-06-13 DIAGNOSIS — M25642 Stiffness of left hand, not elsewhere classified: Secondary | ICD-10-CM | POA: Diagnosis not present

## 2022-06-18 DIAGNOSIS — E1149 Type 2 diabetes mellitus with other diabetic neurological complication: Secondary | ICD-10-CM | POA: Diagnosis not present

## 2022-06-18 DIAGNOSIS — I10 Essential (primary) hypertension: Secondary | ICD-10-CM | POA: Diagnosis not present

## 2022-07-10 DIAGNOSIS — E1149 Type 2 diabetes mellitus with other diabetic neurological complication: Secondary | ICD-10-CM | POA: Diagnosis not present

## 2022-07-10 DIAGNOSIS — Z794 Long term (current) use of insulin: Secondary | ICD-10-CM | POA: Diagnosis not present

## 2022-07-11 DIAGNOSIS — M720 Palmar fascial fibromatosis [Dupuytren]: Secondary | ICD-10-CM | POA: Diagnosis not present

## 2022-08-20 DIAGNOSIS — Z1212 Encounter for screening for malignant neoplasm of rectum: Secondary | ICD-10-CM | POA: Diagnosis not present

## 2022-08-20 DIAGNOSIS — E1149 Type 2 diabetes mellitus with other diabetic neurological complication: Secondary | ICD-10-CM | POA: Diagnosis not present

## 2022-08-20 DIAGNOSIS — Z125 Encounter for screening for malignant neoplasm of prostate: Secondary | ICD-10-CM | POA: Diagnosis not present

## 2022-08-20 DIAGNOSIS — E785 Hyperlipidemia, unspecified: Secondary | ICD-10-CM | POA: Diagnosis not present

## 2022-08-23 DIAGNOSIS — E1149 Type 2 diabetes mellitus with other diabetic neurological complication: Secondary | ICD-10-CM | POA: Diagnosis not present

## 2022-08-23 DIAGNOSIS — Z794 Long term (current) use of insulin: Secondary | ICD-10-CM | POA: Diagnosis not present

## 2022-08-27 DIAGNOSIS — Z0000 Encounter for general adult medical examination without abnormal findings: Secondary | ICD-10-CM | POA: Diagnosis not present

## 2022-08-27 DIAGNOSIS — Z1339 Encounter for screening examination for other mental health and behavioral disorders: Secondary | ICD-10-CM | POA: Diagnosis not present

## 2022-08-27 DIAGNOSIS — Z1331 Encounter for screening for depression: Secondary | ICD-10-CM | POA: Diagnosis not present

## 2022-08-27 DIAGNOSIS — I10 Essential (primary) hypertension: Secondary | ICD-10-CM | POA: Diagnosis not present

## 2022-10-10 DIAGNOSIS — E1149 Type 2 diabetes mellitus with other diabetic neurological complication: Secondary | ICD-10-CM | POA: Diagnosis not present

## 2022-10-10 DIAGNOSIS — Z794 Long term (current) use of insulin: Secondary | ICD-10-CM | POA: Diagnosis not present

## 2022-11-13 DIAGNOSIS — Z794 Long term (current) use of insulin: Secondary | ICD-10-CM | POA: Diagnosis not present

## 2022-11-13 DIAGNOSIS — E1149 Type 2 diabetes mellitus with other diabetic neurological complication: Secondary | ICD-10-CM | POA: Diagnosis not present

## 2022-11-28 ENCOUNTER — Other Ambulatory Visit (HOSPITAL_COMMUNITY): Payer: Self-pay | Admitting: Internal Medicine

## 2022-11-28 DIAGNOSIS — R140 Abdominal distension (gaseous): Secondary | ICD-10-CM

## 2022-11-28 DIAGNOSIS — R110 Nausea: Secondary | ICD-10-CM

## 2022-11-28 DIAGNOSIS — R1013 Epigastric pain: Secondary | ICD-10-CM

## 2022-11-28 DIAGNOSIS — D2262 Melanocytic nevi of left upper limb, including shoulder: Secondary | ICD-10-CM | POA: Diagnosis not present

## 2022-11-28 DIAGNOSIS — D0359 Melanoma in situ of other part of trunk: Secondary | ICD-10-CM | POA: Diagnosis not present

## 2022-11-28 DIAGNOSIS — R109 Unspecified abdominal pain: Secondary | ICD-10-CM | POA: Diagnosis not present

## 2022-11-28 DIAGNOSIS — Z85828 Personal history of other malignant neoplasm of skin: Secondary | ICD-10-CM | POA: Diagnosis not present

## 2022-11-28 DIAGNOSIS — D0362 Melanoma in situ of left upper limb, including shoulder: Secondary | ICD-10-CM | POA: Diagnosis not present

## 2022-11-28 DIAGNOSIS — D225 Melanocytic nevi of trunk: Secondary | ICD-10-CM | POA: Diagnosis not present

## 2022-11-28 DIAGNOSIS — L821 Other seborrheic keratosis: Secondary | ICD-10-CM | POA: Diagnosis not present

## 2022-11-29 ENCOUNTER — Ambulatory Visit (HOSPITAL_COMMUNITY)
Admission: RE | Admit: 2022-11-29 | Discharge: 2022-11-29 | Disposition: A | Discharge status: Home / Self Care | Payer: BC Managed Care – PPO | Source: Ambulatory Visit | Attending: Internal Medicine | Admitting: Internal Medicine

## 2022-11-29 DIAGNOSIS — R110 Nausea: Secondary | ICD-10-CM | POA: Insufficient documentation

## 2022-11-29 DIAGNOSIS — R1013 Epigastric pain: Secondary | ICD-10-CM | POA: Diagnosis not present

## 2022-11-29 DIAGNOSIS — K760 Fatty (change of) liver, not elsewhere classified: Secondary | ICD-10-CM | POA: Diagnosis not present

## 2022-11-29 DIAGNOSIS — R140 Abdominal distension (gaseous): Secondary | ICD-10-CM | POA: Insufficient documentation

## 2022-12-24 DIAGNOSIS — D0362 Melanoma in situ of left upper limb, including shoulder: Secondary | ICD-10-CM | POA: Diagnosis not present

## 2023-03-17 IMAGING — CT CT ORBITS W/O CM
1 series · 16 of 30 positions shown, 20 images · non-contrast
Comparison: CT head 11/02/2020

CLINICAL DATA: Abrasion of nose, contusion of orbital tissue,
bruising- fell on 01/08/2022

EXAM:
CT ORBITS WITHOUT CONTRAST
TECHNIQUE: Multidetector CT imaging of the orbits was performed using the
standard protocol without intravenous contrast. Multiplanar CT image
reconstructions were also generated.
RADIATION DOSE REDUCTION: This exam was performed according to the
departmental dose-optimization program which includes automated
exposure control, adjustment of the mA and/or kV according to
patient size and/or use of iterative reconstruction technique.

[Series 4: orbits-soft · axial · 0.39mm/px · z∈[-122,-48]mm · 16 of 41 slices shown, 20 images]
[im 2/41  brain]
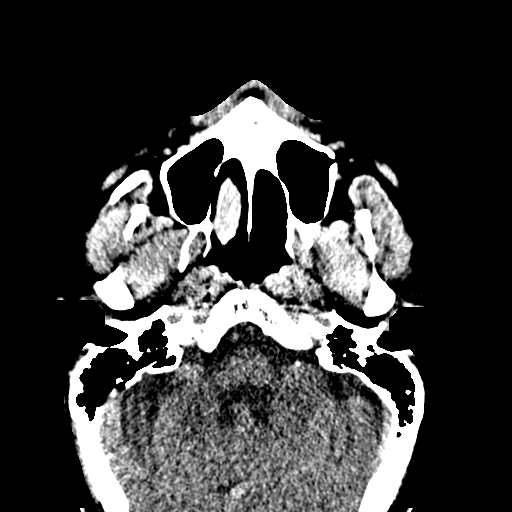
[im 2/41  bone]
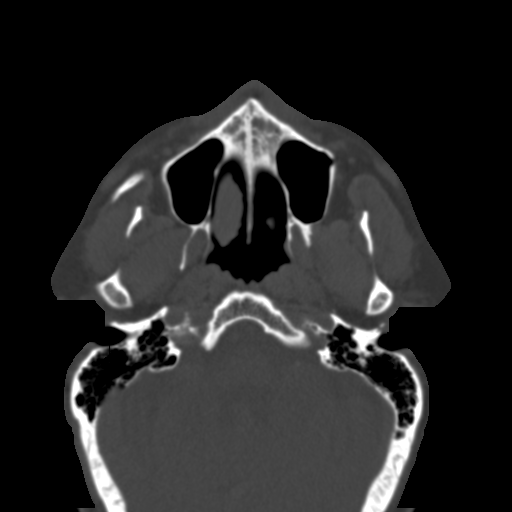
[im 5/41  bone]
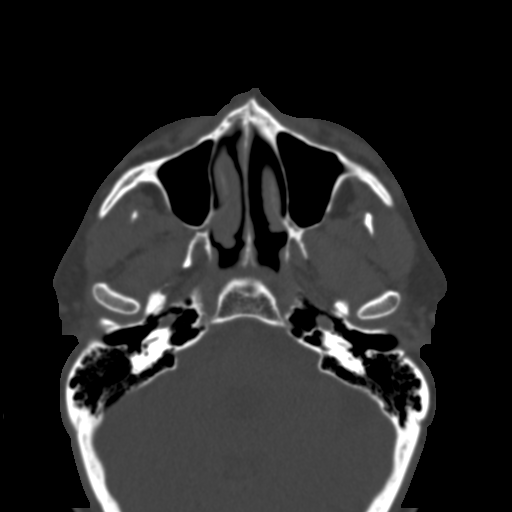
[im 7/41  bone]
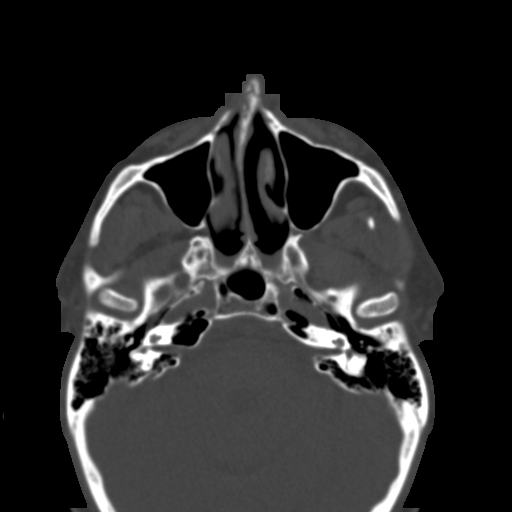
[im 10/41  bone]
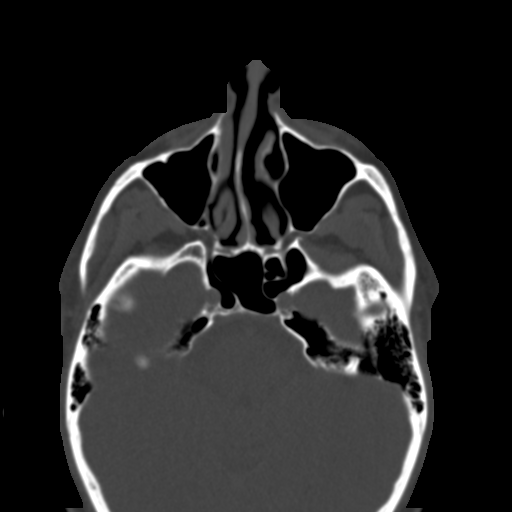
[im 12/41  brain]
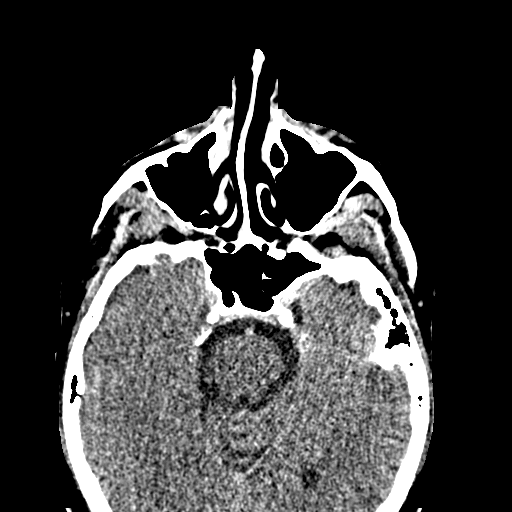
[im 12/41  bone]
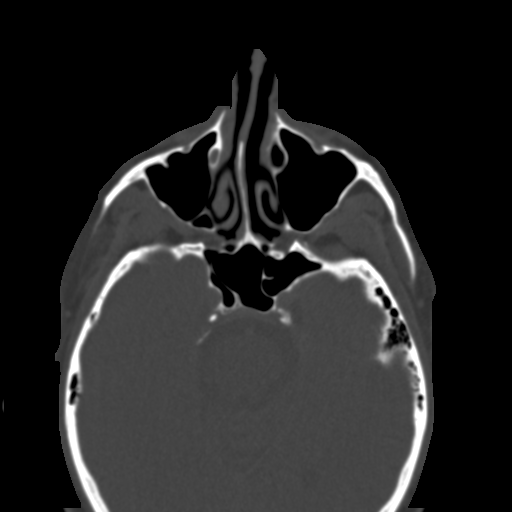
[im 14/41  bone]
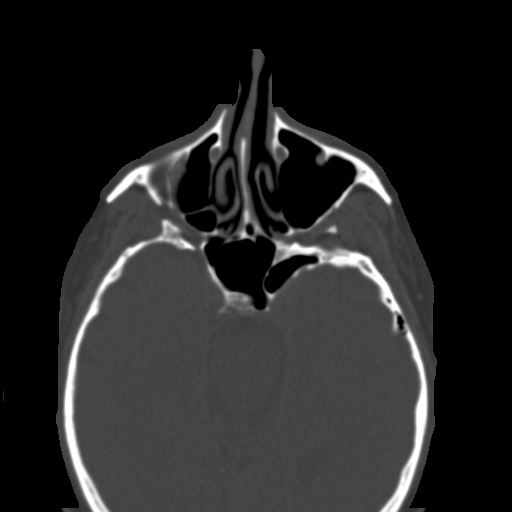
[im 17/41  bone]
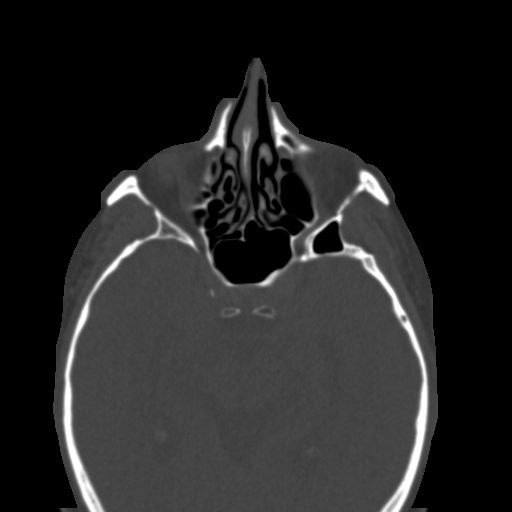
[im 20/41  bone]
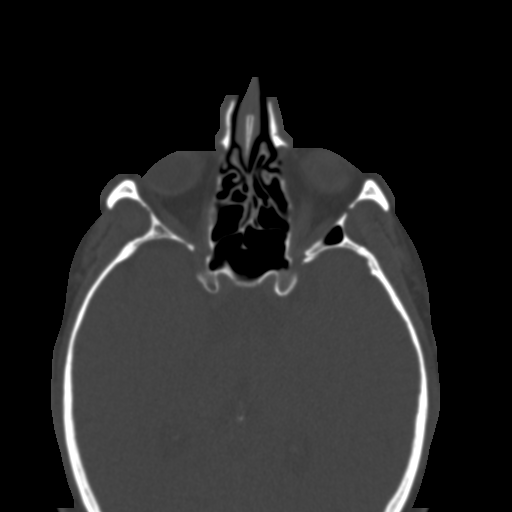
[im 21/41  brain]
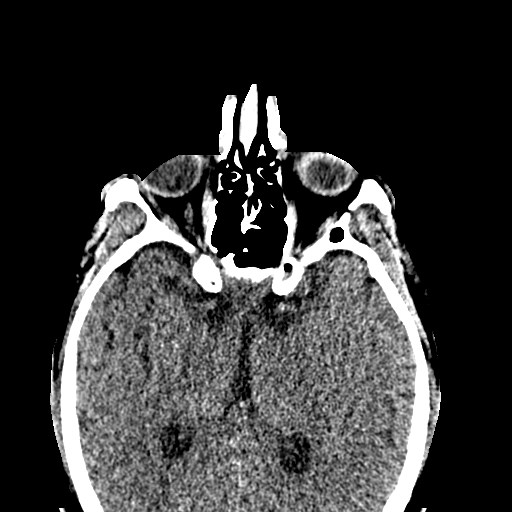
[im 21/41  bone]
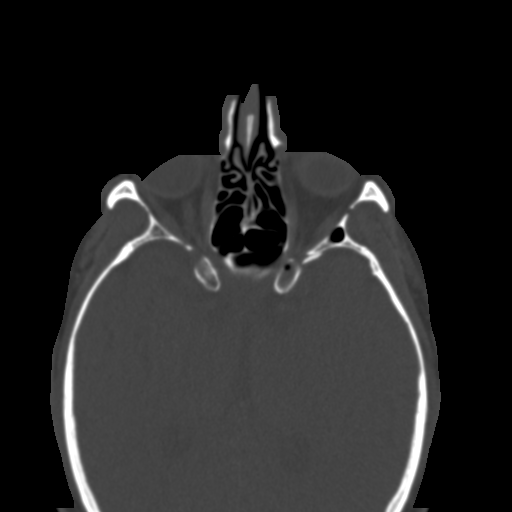
[im 24/41  bone]
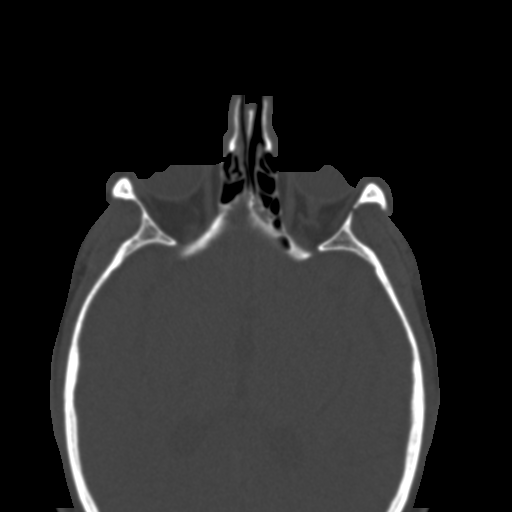
[im 27/41  bone]
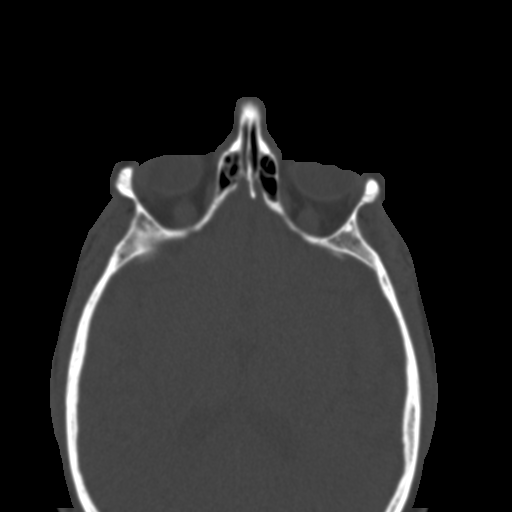
[im 29/41  bone]
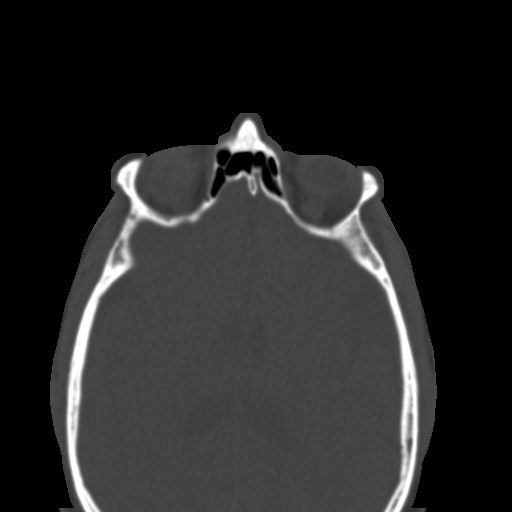
[im 31/41  brain]
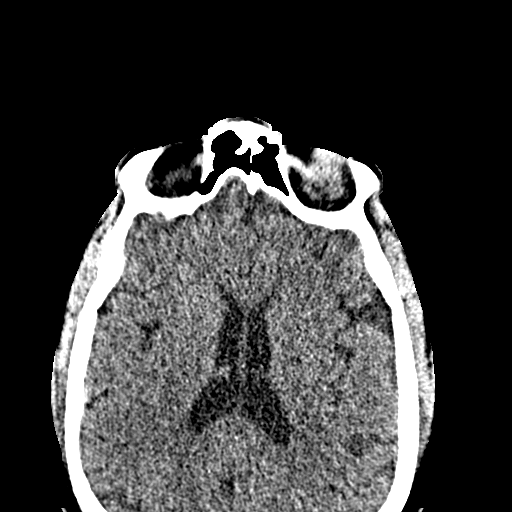
[im 31/41  bone]
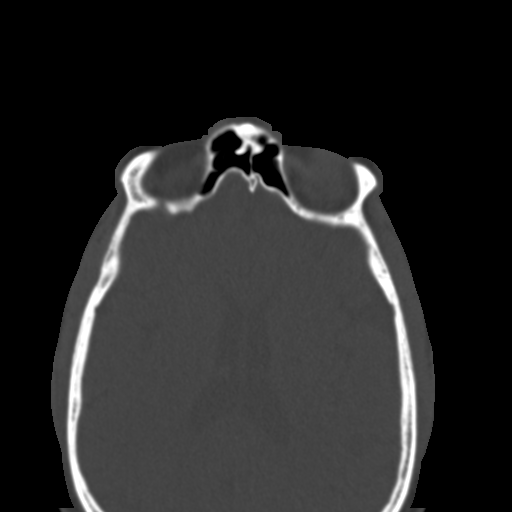
[im 34/41  bone]
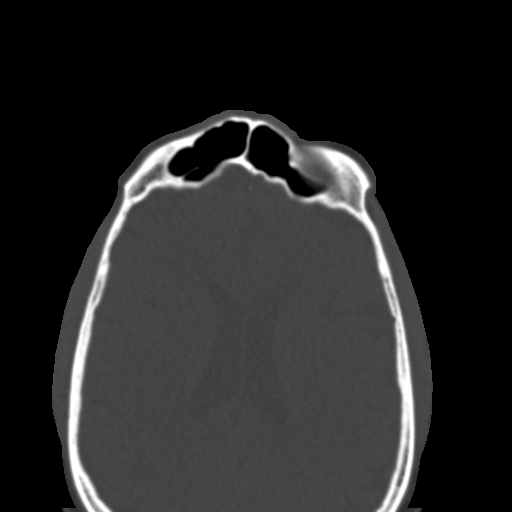
[im 36/41  bone]
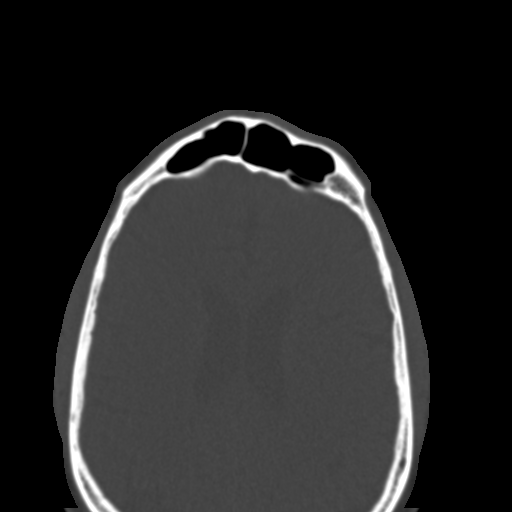
[im 39/41  bone]
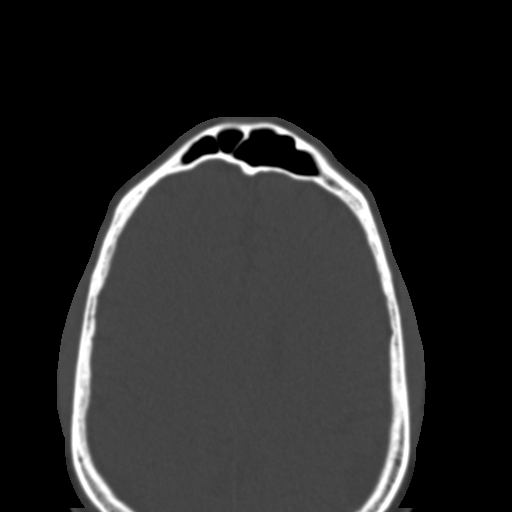

[16 of 30 positions shown; findings below may reference images not displayed]

FINDINGS: Orbits:  No posttraumatic or inflammatory changes of the orbits.

Visible paranasal sinuses: Clear.

Soft tissues: No preseptal soft tissue swelling. No soft tissue
hematoma.

Osseous: Nasal bones intact without fracture. Orbital walls intact.
Visualized facial bones intact without fracture or pathologic
process.

Limited intracranial: No acute or significant finding.
IMPRESSION: Negative.

## 2023-04-07 DIAGNOSIS — E1149 Type 2 diabetes mellitus with other diabetic neurological complication: Secondary | ICD-10-CM | POA: Diagnosis not present

## 2023-04-07 DIAGNOSIS — Z794 Long term (current) use of insulin: Secondary | ICD-10-CM | POA: Diagnosis not present

## 2023-05-05 DIAGNOSIS — F10120 Alcohol abuse with intoxication, uncomplicated: Secondary | ICD-10-CM | POA: Diagnosis not present

## 2023-05-05 DIAGNOSIS — Z79899 Other long term (current) drug therapy: Secondary | ICD-10-CM | POA: Diagnosis not present

## 2023-05-08 DIAGNOSIS — E1149 Type 2 diabetes mellitus with other diabetic neurological complication: Secondary | ICD-10-CM | POA: Diagnosis not present

## 2023-05-08 DIAGNOSIS — I10 Essential (primary) hypertension: Secondary | ICD-10-CM | POA: Diagnosis not present

## 2023-06-06 DIAGNOSIS — Z794 Long term (current) use of insulin: Secondary | ICD-10-CM | POA: Diagnosis not present

## 2023-06-06 DIAGNOSIS — Z85828 Personal history of other malignant neoplasm of skin: Secondary | ICD-10-CM | POA: Diagnosis not present

## 2023-06-06 DIAGNOSIS — E1149 Type 2 diabetes mellitus with other diabetic neurological complication: Secondary | ICD-10-CM | POA: Diagnosis not present

## 2023-06-06 DIAGNOSIS — Z85820 Personal history of malignant melanoma of skin: Secondary | ICD-10-CM | POA: Diagnosis not present

## 2023-06-06 DIAGNOSIS — D225 Melanocytic nevi of trunk: Secondary | ICD-10-CM | POA: Diagnosis not present

## 2023-06-06 DIAGNOSIS — L821 Other seborrheic keratosis: Secondary | ICD-10-CM | POA: Diagnosis not present

## 2023-07-16 DIAGNOSIS — Z794 Long term (current) use of insulin: Secondary | ICD-10-CM | POA: Diagnosis not present

## 2023-07-16 DIAGNOSIS — E1149 Type 2 diabetes mellitus with other diabetic neurological complication: Secondary | ICD-10-CM | POA: Diagnosis not present

## 2023-09-11 DIAGNOSIS — C801 Malignant (primary) neoplasm, unspecified: Secondary | ICD-10-CM

## 2023-09-11 HISTORY — DX: Malignant (primary) neoplasm, unspecified: C80.1

## 2023-09-16 DIAGNOSIS — Z1212 Encounter for screening for malignant neoplasm of rectum: Secondary | ICD-10-CM | POA: Diagnosis not present

## 2023-09-16 DIAGNOSIS — E785 Hyperlipidemia, unspecified: Secondary | ICD-10-CM | POA: Diagnosis not present

## 2023-09-16 DIAGNOSIS — Z125 Encounter for screening for malignant neoplasm of prostate: Secondary | ICD-10-CM | POA: Diagnosis not present

## 2023-09-16 DIAGNOSIS — E1149 Type 2 diabetes mellitus with other diabetic neurological complication: Secondary | ICD-10-CM | POA: Diagnosis not present

## 2023-09-25 DIAGNOSIS — Z0000 Encounter for general adult medical examination without abnormal findings: Secondary | ICD-10-CM | POA: Diagnosis not present

## 2023-09-25 DIAGNOSIS — R82998 Other abnormal findings in urine: Secondary | ICD-10-CM | POA: Diagnosis not present

## 2023-09-25 DIAGNOSIS — Z23 Encounter for immunization: Secondary | ICD-10-CM | POA: Diagnosis not present

## 2023-09-25 DIAGNOSIS — Z1331 Encounter for screening for depression: Secondary | ICD-10-CM | POA: Diagnosis not present

## 2023-09-25 DIAGNOSIS — E1149 Type 2 diabetes mellitus with other diabetic neurological complication: Secondary | ICD-10-CM | POA: Diagnosis not present

## 2023-10-12 DIAGNOSIS — E1149 Type 2 diabetes mellitus with other diabetic neurological complication: Secondary | ICD-10-CM | POA: Diagnosis not present

## 2023-10-12 DIAGNOSIS — Z794 Long term (current) use of insulin: Secondary | ICD-10-CM | POA: Diagnosis not present

## 2023-10-15 DIAGNOSIS — E1149 Type 2 diabetes mellitus with other diabetic neurological complication: Secondary | ICD-10-CM | POA: Diagnosis not present

## 2023-10-15 DIAGNOSIS — Z794 Long term (current) use of insulin: Secondary | ICD-10-CM | POA: Diagnosis not present

## 2023-11-06 DIAGNOSIS — E113293 Type 2 diabetes mellitus with mild nonproliferative diabetic retinopathy without macular edema, bilateral: Secondary | ICD-10-CM | POA: Diagnosis not present

## 2023-11-06 DIAGNOSIS — H26492 Other secondary cataract, left eye: Secondary | ICD-10-CM | POA: Diagnosis not present

## 2023-11-06 DIAGNOSIS — H4322 Crystalline deposits in vitreous body, left eye: Secondary | ICD-10-CM | POA: Diagnosis not present

## 2023-11-06 DIAGNOSIS — Z961 Presence of intraocular lens: Secondary | ICD-10-CM | POA: Diagnosis not present

## 2023-11-15 ENCOUNTER — Encounter: Payer: Self-pay | Admitting: Pediatrics

## 2023-11-28 DIAGNOSIS — Z794 Long term (current) use of insulin: Secondary | ICD-10-CM | POA: Diagnosis not present

## 2023-11-28 DIAGNOSIS — E1149 Type 2 diabetes mellitus with other diabetic neurological complication: Secondary | ICD-10-CM | POA: Diagnosis not present

## 2023-12-02 DIAGNOSIS — D2261 Melanocytic nevi of right upper limb, including shoulder: Secondary | ICD-10-CM | POA: Diagnosis not present

## 2023-12-02 DIAGNOSIS — D225 Melanocytic nevi of trunk: Secondary | ICD-10-CM | POA: Diagnosis not present

## 2023-12-02 DIAGNOSIS — Z85828 Personal history of other malignant neoplasm of skin: Secondary | ICD-10-CM | POA: Diagnosis not present

## 2023-12-02 DIAGNOSIS — F0781 Postconcussional syndrome: Secondary | ICD-10-CM | POA: Insufficient documentation

## 2023-12-02 DIAGNOSIS — D224 Melanocytic nevi of scalp and neck: Secondary | ICD-10-CM | POA: Diagnosis not present

## 2023-12-04 DIAGNOSIS — M65311 Trigger thumb, right thumb: Secondary | ICD-10-CM | POA: Diagnosis not present

## 2023-12-04 DIAGNOSIS — M79641 Pain in right hand: Secondary | ICD-10-CM | POA: Diagnosis not present

## 2023-12-04 DIAGNOSIS — M65322 Trigger finger, left index finger: Secondary | ICD-10-CM | POA: Diagnosis not present

## 2023-12-04 DIAGNOSIS — M79642 Pain in left hand: Secondary | ICD-10-CM | POA: Diagnosis not present

## 2023-12-12 ENCOUNTER — Telehealth (AMBULATORY_SURGERY_CENTER): Payer: Self-pay

## 2023-12-12 ENCOUNTER — Ambulatory Visit (AMBULATORY_SURGERY_CENTER)

## 2023-12-12 VITALS — Ht 70.0 in | Wt 200.0 lb

## 2023-12-12 DIAGNOSIS — Z860100 Personal history of colon polyps, unspecified: Secondary | ICD-10-CM

## 2023-12-12 DIAGNOSIS — Z800 Family history of malignant neoplasm of digestive organs: Secondary | ICD-10-CM

## 2023-12-12 MED ORDER — NA SULFATE-K SULFATE-MG SULF 17.5-3.13-1.6 GM/177ML PO SOLN: 1.0000 | Freq: Once | ORAL | 0 refills | Status: AC

## 2023-12-12 NOTE — Telephone Encounter (Signed)
 During pre visit patient disclosed that he was now using an insulin pump.  The prescribing physician, Dr. Gerlene Burdock [phone], will need to be contacted to obtain dosage instructions.  Colonoscopy is scheduled for 12/25/23.

## 2023-12-12 NOTE — Progress Notes (Signed)

## 2023-12-12 NOTE — Telephone Encounter (Signed)
 Insulin pump request has been faxed to Dr. Geoffry Paradise at [phone].

## 2023-12-16 NOTE — Telephone Encounter (Signed)
 Left vm for Dr. Lanell Matar CMA Fleet Contras) to follow up on request

## 2023-12-17 NOTE — Telephone Encounter (Signed)
 Received response from Dr. Lanell Matar office regarding insulin pump clearance. The notation is not legible. Called Dr. Lanell Matar office and was informed that Marchelle Folks, RMA is working with him today. I left Harlan, RMA a detailed vm asking that they send over a more legible copy of instructions or to give me a call back for a verbal order. I left my direct office number for Marchelle Folks, RMA to call back to discuss.

## 2023-12-18 NOTE — Telephone Encounter (Signed)
 Received updated fax with insulin pump instructions - "Basal as usual, no Bolus with intake. NPO." A copy of recommendations will be sent to be scanned. Patient notified via MyChart.

## 2023-12-23 ENCOUNTER — Encounter: Payer: Self-pay | Admitting: Pediatrics

## 2023-12-23 NOTE — Progress Notes (Unsigned)
 [facility] History and Physical   Primary Care Physician:  Geoffry Paradise, MD   Reason for Procedure:  History of colon polyp of unknown histology, family history of colorectal cancer in a first-degree relative less than age 61  Plan:    Colonoscopy  HPI: Micheal Edler. is a 63 y.o. male undergoing colonoscopy for surveillance of a previous history of a colon polyp of unknown histology.  Patient has a pertinent family history of colorectal cancer in his father diagnosed in his 79s.  No change in bowel habits or rectal bleeding.   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 Authorizing Provider  Ascorbic Acid (VITAMIN C) 1000 MG tablet Vitamin C    [provider]  Cholecalciferol (D3 ADULT PO) Take by mouth.    [provider]  Cyanocobalamin (VITAMIN B-12 CR PO) Take by mouth.    [provider]  gabapentin (NEURONTIN) 100 MG capsule Take 100 mg by mouth daily.    [provider]  glucose blood (ONETOUCH VERIO) test strip TEST 4 TIMES DAILY AS DIRECTED Patient not taking: Reported on 12/12/2023 07/02/16   [provider]  Insulin Disposable Pump (V-GO 30) KIT USE 1 DEVICE DAILY - 30 UNITS BASAL, UP TO 36 UNITS BOLUS  PER DAY 08/15/16   [provider]  insulin lispro (HUMALOG) 100 UNIT/ML injection USE VIA INSULIN PUMP UP TO 100 UNITS PER DAY. 11/05/23   [provider]  JARDIANCE 25 MG TABS tablet TK 1 T PO D 03/02/18   [provider]  lisinopril (PRINIVIL,ZESTRIL) 20 MG tablet Take 20 mg by mouth daily.    [provider]  metFORMIN (GLUCOPHAGE) 500 MG tablet TK 1 T PO BID 03/10/18   [provider]  omeprazole (PRILOSEC) 20 MG capsule Take 20 mg by mouth daily.    [provider]  ONE TOUCH ULTRA TEST test strip  12/15/14   [provider]  simvastatin (ZOCOR) 40 MG tablet Take 40 mg by mouth daily.     [provider]    Current Outpatient Medications  Medication Sig Dispense Refill   Ascorbic Acid (VITAMIN C) 1000 MG tablet Vitamin C     Cholecalciferol (D3 ADULT PO) Take by mouth.     Cyanocobalamin (VITAMIN B-12 CR PO) Take by mouth.     gabapentin (NEURONTIN) 100 MG capsule Take 100 mg by mouth daily.     glucose blood (ONETOUCH VERIO) test strip TEST 4 TIMES DAILY AS DIRECTED (Patient not taking: Reported on 12/12/2023)  Insulin Disposable Pump (V-GO 30) KIT USE 1 DEVICE DAILY - 30 UNITS BASAL, UP TO 36 UNITS BOLUS PER DAY     insulin lispro (HUMALOG) 100 UNIT/ML injection USE VIA INSULIN PUMP UP TO 100 UNITS PER DAY.     JARDIANCE 25 MG TABS tablet TK 1 T PO D  6   lisinopril (PRINIVIL,ZESTRIL) 20 MG tablet Take 20 mg by mouth daily.     metFORMIN (GLUCOPHAGE) 500 MG tablet TK 1 T PO BID  5   omeprazole (PRILOSEC) 20 MG capsule Take 20 mg by mouth daily.     ONE TOUCH ULTRA TEST test strip  (Patient not taking: Reported on 12/12/2023)  10   simvastatin (ZOCOR) 40 MG tablet Take 40 mg by mouth daily.      No current facility-administered medications for this visit.    Allergies as of 12/25/2023 - Review Complete 12/12/2023  Allergen Reaction Noted   Sulfa antibiotics Anaphylaxis 02/17/2013    Family History  Problem  Relation Age of Onset   Diabetes Mother    Colon polyps Father    Colon cancer Father 56   Esophageal cancer Neg Hx    Rectal cancer Neg Hx    Stomach cancer Neg Hx     Social History   Socioeconomic History   Marital status: Married    Spouse name: Micheal Fields"   Number of children: 3   Years of education: Ba    Highest education level: Not on file  Occupational History   Occupation: [employer]  Tobacco Use   Smoking status: Never   Smokeless tobacco: Former    Types: Chew   Tobacco comments:    occasional  Substance and Sexual Activity   Alcohol use: Yes    Alcohol/week: 6.0 standard drinks of alcohol    Types: 6 Cans of beer per week    Comment: weekly but not daily - weekends    Drug use: No   Sexual activity: Not on file  Other Topics Concern   Not on file  Social History Narrative   Lives at home with wife   Caffeine use: Drinks 1 cup coffee in morning   Social Drivers of Corporate investment banker Strain: Not on file  Food Insecurity: Not on file  Transportation Needs: Not on file  Physical Activity: Not on file  Stress: Not on file  Social Connections: Not on file  Intimate Partner Violence: Not on file    Review of Systems:  All other review of systems negative except as mentioned in the HPI.  Physical Exam: Vital signs There were no vitals taken for this visit.  General:   Alert,  Well-developed, well-nourished, pleasant and cooperative in NAD Airway:  Mallampati  Lungs:  Clear throughout to auscultation.   Heart:  Regular rate and rhythm; no murmurs, clicks, rubs,  or gallops. Abdomen:  Soft, nontender and nondistended. Normal bowel sounds.   Neuro/Psych:  Normal mood and affect. A and O x 3  Micheal Hess, MD [facility]

## 2023-12-25 ENCOUNTER — Encounter: Payer: Self-pay | Admitting: Pediatrics

## 2023-12-25 ENCOUNTER — Ambulatory Visit (AMBULATORY_SURGERY_CENTER): Admitting: Pediatrics

## 2023-12-25 VITALS — BP 115/53 | HR 63 | Temp 97.9°F | Resp 13 | Ht 70.0 in | Wt 200.0 lb

## 2023-12-25 DIAGNOSIS — D122 Benign neoplasm of ascending colon: Secondary | ICD-10-CM | POA: Diagnosis not present

## 2023-12-25 DIAGNOSIS — Z800 Family history of malignant neoplasm of digestive organs: Secondary | ICD-10-CM

## 2023-12-25 DIAGNOSIS — Z860100 Personal history of colon polyps, unspecified: Secondary | ICD-10-CM

## 2023-12-25 DIAGNOSIS — Z1211 Encounter for screening for malignant neoplasm of colon: Secondary | ICD-10-CM

## 2023-12-25 MED ORDER — SODIUM CHLORIDE 0.9 % IV SOLN: 500.0000 mL | Freq: Once | INTRAVENOUS | Status: DC

## 2023-12-25 NOTE — Patient Instructions (Signed)
 Discharge instructions given. Handout on polyps. Resume previous medications. YOU HAD AN ENDOSCOPIC PROCEDURE TODAY AT THE [facility]:   Refer to the procedure report that was given to you for any specific questions about what was found during the examination.  If the procedure report does not answer your questions, please call your gastroenterologist to clarify.  If you requested that your care partner not be given the details of your procedure findings, then the procedure report has been included in a sealed envelope for you to review at your convenience later.  YOU SHOULD EXPECT: Some feelings of bloating in the abdomen. Passage of more gas than usual.  Walking can help get rid of the air that was put into your GI tract during the procedure and reduce the bloating. If you had a lower endoscopy (such as a colonoscopy or flexible sigmoidoscopy) you may notice spotting of blood in your stool or on the toilet paper. If you underwent a bowel prep for your procedure, you may not have a normal bowel movement for a few days.  Please Note:  You might notice some irritation and congestion in your nose or some drainage.  This is from the oxygen used during your procedure.  There is no need for concern and it should clear up in a day or so.  SYMPTOMS TO REPORT IMMEDIATELY:  Following lower endoscopy (colonoscopy or flexible sigmoidoscopy):  Excessive amounts of blood in the stool  Significant tenderness or worsening of abdominal pains  Swelling of the abdomen that is new, acute  Fever of 100F or higher   For urgent or emergent issues, a gastroenterologist can be reached at any hour by calling [phone]. Do not use MyChart messaging for urgent concerns.    DIET:  We do recommend a small meal at first, but then you may proceed to your regular diet.  Drink plenty of fluids but you should avoid alcoholic beverages for 24 hours.  ACTIVITY:  You should plan to take it easy for the rest  of today and you should NOT DRIVE or use heavy machinery until tomorrow (because of the sedation medicines used during the test).    FOLLOW UP: Our staff will call the number listed on your records the next business day following your procedure.  We will call around 7:15- 8:00 am to check on you and address any questions or concerns that you may have regarding the information given to you following your procedure. If we do not reach you, we will leave a message.     If any biopsies were taken you will be contacted by phone or by letter within the next 1-3 weeks.  Please call us at [phone] if you have not heard about the biopsies in 3 weeks.    SIGNATURES/CONFIDENTIALITY: You and/or your care partner have signed paperwork which will be entered into your electronic medical record.  These signatures attest to the fact that that the information above on your After Visit Summary has been reviewed and is understood.  Full responsibility of the confidentiality of this discharge information lies with you and/or your care-partner.

## 2023-12-25 NOTE — Op Note (Signed)
 [facility] Patient Name: Micheal Fields Procedure Date: 12/25/2023 8:18 AM MRN: [id number] Endoscopist: Maren Beach , MD, [id number] Age: 63 Referring MD:  Date of Birth: 10/05/1960 Gender: Male Account #: [id number] Procedure:                Colonoscopy Indications:              High risk colon cancer surveillance: Personal                            history of colonic polyps, family history of                            colorectal cancer in first-degree relative?"father                            diagnosed in his 40s, last colonoscopy: July 2019                            -normal without polyps Medicines:                Monitored Anesthesia Care Procedure:                Pre-Anesthesia Assessment:                           - Prior to the procedure, a History and Physical                            was performed, and patient medications and                            allergies were reviewed. The patient's tolerance of                            previous anesthesia was also reviewed. The risks                            and benefits of the procedure and the sedation                            options and risks were discussed with the patient.                            All questions were answered, and informed consent                            was obtained. Prior Anticoagulants: The patient has                            taken no anticoagulant or antiplatelet agents. ASA                            Grade Assessment: II - A patient with mild systemic  disease. After reviewing the risks and benefits,                            the patient was deemed in satisfactory condition to                            undergo the procedure.                           After obtaining informed consent, the colonoscope                            was passed under direct vision. Throughout the                            procedure, the patient's blood pressure, pulse,  and                            oxygen saturations were monitored continuously. The                            Olympus Scope SN: [id number] was introduced through                            the anus and advanced to the cecum, identified by                            appendiceal orifice and ileocecal valve. The                            colonoscopy was performed without difficulty. The                            patient tolerated the procedure well. The quality                            of the bowel preparation was good. The ileocecal                            valve, appendiceal orifice, and rectum were                            photographed. Scope In: 8:24:37 AM Scope Out: 8:41:00 AM Scope Withdrawal Time: 0 hours 12 minutes 46 seconds  Total Procedure Duration: 0 hours 16 minutes 23 seconds  Findings:                 The perianal and digital rectal examinations were                            normal. Pertinent negatives include normal                            sphincter tone and no palpable rectal lesions.  Two sessile polyps were found in the ascending                            colon. The polyps were 5 to 7 mm in size. These                            polyps were removed with a cold snare. Resection                            and retrieval were complete.                           The exam was otherwise normal throughout the                            examined colon.                           The retroflexed view of the distal rectum and anal                            verge was normal and showed no anal or rectal                            abnormalities. Complications:            No immediate complications. Estimated blood loss:                            Minimal. Estimated Blood Loss:     Estimated blood loss was minimal. Impression:               - Two 5 to 7 mm polyps in the ascending colon,                            removed with a cold snare. Resected  and retrieved. Recommendation:           - Discharge patient to home (ambulatory).                           - Await pathology results.                           - Repeat colonoscopy in 5 years for surveillance.                           - The findings and recommendations were discussed                            with the patient's family.                           - Return to referring physician.                           - Patient has a contact  number available for                            emergencies. The signs and symptoms of potential                            delayed complications were discussed with the                            patient. Return to normal activities tomorrow.                            Written discharge instructions were provided to the                            patient. Eugenia Hess, MD 12/25/2023 8:45:13 AM This report has been signed electronically.

## 2023-12-25 NOTE — Progress Notes (Signed)
 Sedate, gd SR, tolerated procedure well, VSS, report to RN

## 2023-12-25 NOTE — Progress Notes (Signed)
 Pt's states no medical or surgical changes since previsit or office visit.

## 2023-12-26 ENCOUNTER — Telehealth: Payer: Self-pay

## 2023-12-26 NOTE — Telephone Encounter (Signed)
  Follow up Call-     12/25/2023    7:49 AM  Call back number  Post procedure Call Back phone  # [phone]  Permission to leave phone message Yes     Patient questions:  Do you have a fever, pain , or abdominal swelling? No. Pain Score  0 *  Have you tolerated food without any problems? Yes.    Have you been able to return to your normal activities? Yes.    Do you have any questions about your discharge instructions: Diet   No. Medications  No. Follow up visit  No.  Do you have questions or concerns about your Care? No.  Actions: * If pain score is 4 or above: No action needed, pain <4.

## 2023-12-27 LAB — SURGICAL PATHOLOGY

## 2023-12-30 ENCOUNTER — Encounter: Payer: Self-pay | Admitting: Pediatrics

## 2024-01-08 DIAGNOSIS — M65332 Trigger finger, left middle finger: Secondary | ICD-10-CM | POA: Diagnosis not present

## 2024-01-08 DIAGNOSIS — M65322 Trigger finger, left index finger: Secondary | ICD-10-CM | POA: Diagnosis not present

## 2024-01-08 DIAGNOSIS — M720 Palmar fascial fibromatosis [Dupuytren]: Secondary | ICD-10-CM | POA: Diagnosis not present

## 2024-01-08 DIAGNOSIS — M65311 Trigger thumb, right thumb: Secondary | ICD-10-CM | POA: Diagnosis not present

## 2024-02-18 DIAGNOSIS — Z794 Long term (current) use of insulin: Secondary | ICD-10-CM | POA: Diagnosis not present

## 2024-02-18 DIAGNOSIS — E1149 Type 2 diabetes mellitus with other diabetic neurological complication: Secondary | ICD-10-CM | POA: Diagnosis not present

## 2024-04-01 DIAGNOSIS — M720 Palmar fascial fibromatosis [Dupuytren]: Secondary | ICD-10-CM | POA: Diagnosis not present

## 2024-04-01 DIAGNOSIS — M65311 Trigger thumb, right thumb: Secondary | ICD-10-CM | POA: Diagnosis not present

## 2024-04-01 DIAGNOSIS — M65322 Trigger finger, left index finger: Secondary | ICD-10-CM | POA: Diagnosis not present

## 2024-04-01 DIAGNOSIS — M65331 Trigger finger, right middle finger: Secondary | ICD-10-CM | POA: Diagnosis not present

## 2024-04-12 DIAGNOSIS — G5622 Lesion of ulnar nerve, left upper limb: Secondary | ICD-10-CM | POA: Diagnosis not present

## 2024-04-12 DIAGNOSIS — G5603 Carpal tunnel syndrome, bilateral upper limbs: Secondary | ICD-10-CM | POA: Diagnosis not present

## 2024-04-12 DIAGNOSIS — E1142 Type 2 diabetes mellitus with diabetic polyneuropathy: Secondary | ICD-10-CM | POA: Diagnosis not present

## 2024-04-24 DIAGNOSIS — M542 Cervicalgia: Secondary | ICD-10-CM | POA: Diagnosis not present

## 2024-04-27 DIAGNOSIS — M720 Palmar fascial fibromatosis [Dupuytren]: Secondary | ICD-10-CM | POA: Diagnosis not present

## 2024-04-27 DIAGNOSIS — E1040 Type 1 diabetes mellitus with diabetic neuropathy, unspecified: Secondary | ICD-10-CM | POA: Diagnosis not present

## 2024-04-27 DIAGNOSIS — G5623 Lesion of ulnar nerve, bilateral upper limbs: Secondary | ICD-10-CM | POA: Diagnosis not present

## 2024-04-27 DIAGNOSIS — G5603 Carpal tunnel syndrome, bilateral upper limbs: Secondary | ICD-10-CM | POA: Diagnosis not present

## 2024-04-28 DIAGNOSIS — Z794 Long term (current) use of insulin: Secondary | ICD-10-CM | POA: Diagnosis not present

## 2024-04-28 DIAGNOSIS — E1149 Type 2 diabetes mellitus with other diabetic neurological complication: Secondary | ICD-10-CM | POA: Diagnosis not present

## 2024-05-05 ENCOUNTER — Other Ambulatory Visit: Payer: Self-pay | Admitting: Orthopedic Surgery

## 2024-05-05 DIAGNOSIS — G5622 Lesion of ulnar nerve, left upper limb: Secondary | ICD-10-CM | POA: Diagnosis not present

## 2024-05-05 DIAGNOSIS — G5602 Carpal tunnel syndrome, left upper limb: Secondary | ICD-10-CM | POA: Diagnosis not present

## 2024-05-05 DIAGNOSIS — M720 Palmar fascial fibromatosis [Dupuytren]: Secondary | ICD-10-CM | POA: Diagnosis not present

## 2024-05-06 LAB — SURGICAL PATHOLOGY

## 2024-05-12 DIAGNOSIS — M720 Palmar fascial fibromatosis [Dupuytren]: Secondary | ICD-10-CM | POA: Diagnosis not present

## 2024-05-12 DIAGNOSIS — S6412XD Injury of median nerve at wrist and hand level of left arm, subsequent encounter: Secondary | ICD-10-CM | POA: Diagnosis not present

## 2024-05-12 DIAGNOSIS — S5402XD Injury of ulnar nerve at forearm level, left arm, subsequent encounter: Secondary | ICD-10-CM | POA: Diagnosis not present

## 2024-05-18 DIAGNOSIS — S6412XD Injury of median nerve at wrist and hand level of left arm, subsequent encounter: Secondary | ICD-10-CM | POA: Diagnosis not present

## 2024-05-18 DIAGNOSIS — M720 Palmar fascial fibromatosis [Dupuytren]: Secondary | ICD-10-CM | POA: Diagnosis not present

## 2024-05-18 DIAGNOSIS — S5402XD Injury of ulnar nerve at forearm level, left arm, subsequent encounter: Secondary | ICD-10-CM | POA: Diagnosis not present

## 2024-05-21 DIAGNOSIS — Z794 Long term (current) use of insulin: Secondary | ICD-10-CM | POA: Diagnosis not present

## 2024-05-21 DIAGNOSIS — E1149 Type 2 diabetes mellitus with other diabetic neurological complication: Secondary | ICD-10-CM | POA: Diagnosis not present

## 2024-05-25 DIAGNOSIS — S6412XD Injury of median nerve at wrist and hand level of left arm, subsequent encounter: Secondary | ICD-10-CM | POA: Diagnosis not present

## 2024-05-25 DIAGNOSIS — M720 Palmar fascial fibromatosis [Dupuytren]: Secondary | ICD-10-CM | POA: Diagnosis not present

## 2024-05-25 DIAGNOSIS — S5402XD Injury of ulnar nerve at forearm level, left arm, subsequent encounter: Secondary | ICD-10-CM | POA: Diagnosis not present

## 2024-06-02 DIAGNOSIS — R0789 Other chest pain: Secondary | ICD-10-CM | POA: Diagnosis not present

## 2024-06-02 DIAGNOSIS — I10 Essential (primary) hypertension: Secondary | ICD-10-CM | POA: Diagnosis not present

## 2024-06-08 DIAGNOSIS — S5402XD Injury of ulnar nerve at forearm level, left arm, subsequent encounter: Secondary | ICD-10-CM | POA: Diagnosis not present

## 2024-06-08 DIAGNOSIS — S6412XD Injury of median nerve at wrist and hand level of left arm, subsequent encounter: Secondary | ICD-10-CM | POA: Diagnosis not present

## 2024-06-08 DIAGNOSIS — M720 Palmar fascial fibromatosis [Dupuytren]: Secondary | ICD-10-CM | POA: Diagnosis not present

## 2024-06-15 DIAGNOSIS — S6412XD Injury of median nerve at wrist and hand level of left arm, subsequent encounter: Secondary | ICD-10-CM | POA: Diagnosis not present

## 2024-06-15 DIAGNOSIS — S5402XD Injury of ulnar nerve at forearm level, left arm, subsequent encounter: Secondary | ICD-10-CM | POA: Diagnosis not present

## 2024-06-15 DIAGNOSIS — M720 Palmar fascial fibromatosis [Dupuytren]: Secondary | ICD-10-CM | POA: Diagnosis not present

## 2024-06-18 ENCOUNTER — Ambulatory Visit: Attending: Internal Medicine | Admitting: Internal Medicine

## 2024-06-18 VITALS — BP 128/62 | HR 82 | Ht 70.0 in | Wt 204.8 lb

## 2024-06-18 DIAGNOSIS — Z01812 Encounter for preprocedural laboratory examination: Secondary | ICD-10-CM

## 2024-06-18 DIAGNOSIS — R079 Chest pain, unspecified: Secondary | ICD-10-CM

## 2024-06-18 MED ORDER — NITROGLYCERIN 0.4 MG SL SUBL: 0.4000 mg | SUBLINGUAL_TABLET | SUBLINGUAL | 5 refills | Status: AC | PRN

## 2024-06-18 NOTE — Progress Notes (Unsigned)
 Cardiology Office Note   Date:  06/18/2024   ID:  Micheal Fields., DOB 16-May-1961, MRN [id number]  PCP:  Shepard Ade, MD  Cardiologist:   [name], MD    Pt presents for evaluation of CP     History of Present Illness: Micheal Fields. is a 63 y.o. male with a history of T2DM (on Insulin ).  He is followed by [name]. CT calcium score in May 2025 Score was 243.    The pt is active   Works in yard, goes to gym At the end of Aug 2025 he had sugery on his arm and hand.  Placed on meloxicam and doxycycline.    Placed on Meloxicam and also Doxycycline    He stopped taking the doxycycline soon after starting      Over the past few weeks he has had epsiodes of chest pains   Occur only with exertion.   He will stop what he is doing and it will ease in a couple minutes    No episodes at rest. He can continue on with activity after      Denies SOB  No diaphoresis.  The pt was seen at [facility] by APP 2 wks ago   EKG done  BP 115/   Given Rx for PPI (he had been on omeprazole OTC prior)    The pt says he has a hx of GERD but this discomfort is different from his GERD pain   In the past 2 wks he has continued to have episodes of chest pain.  Unchanged    He was at [employer] game on Saturday  Was waking in parking lot   Got discomfort   Stopped   Discomfort eased   He continued on    He also had an epiosde of pain while mowing lawn.   Came into house   Took tsp of baking soda and water   Calmed down        The pt has a hx of T2DM   He has had an insulin  pump for about 10 years        Current Meds  Medication Sig  . Cholecalciferol (D3 ADULT PO) Take 1,000 Units by mouth daily.  SABRA gabapentin (NEURONTIN) 100 MG capsule Take 100 mg by mouth daily.  SABRA glucose blood (ONETOUCH VERIO) test strip TEST 4 TIMES DAILY AS DIRECTED  . Insulin  Disposable Pump (V-GO 30) KIT USE 1 DEVICE DAILY - 30 UNITS BASAL, UP TO 36 UNITS BOLUS PER DAY  . insulin  lispro (HUMALOG) 100 UNIT/ML injection  USE VIA INSULIN  PUMP UP TO 100 UNITS PER DAY.  SABRA JARDIANCE 25 MG TABS tablet TK 1 T PO D  . lisinopril (ZESTRIL) 20 MG tablet Take 20 mg by mouth 2 (two) times daily.  . metFORMIN (GLUCOPHAGE) 500 MG tablet TK 1 T PO BID  . ONE TOUCH ULTRA TEST test strip   . pantoprazole (PROTONIX) 40 MG tablet Take 40 mg by mouth 2 (two) times daily.  . simvastatin (ZOCOR) 40 MG tablet Take 40 mg by mouth daily.      Allergies:   Sulfa antibiotics   Past Medical History:  Diagnosis Date  . Allergy    mild   . Arthritis   . Cancer (HCC) 2025   Shoulder skin cancer  . Cataract 2022   Bilateral  . Diabetes mellitus without complication (HCC)   . GERD (gastroesophageal reflux disease)   . Hyperlipidemia  takes meds due to DM- no actual high cholesterol   . Hypertension    takes meds due to DM- no actual high BP   . 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 COLONOSCOPY    . ELBOW SURGERY     left elbow  . LAMINECTOMY     l 4-5  . TRIGGER FINGER RELEASE Right    middle finger  . UPPER GASTROINTESTINAL ENDOSCOPY    . WISDOM TOOTH EXTRACTION       Social History:  The patient  reports that he has never smoked. He has quit using smokeless tobacco.  His smokeless tobacco use included chew. He reports current alcohol  use of about 6.0 standard drinks of alcohol  per week. He reports that he does not use drugs.   Family History:  The patient's family history includes Colon cancer (age of onset: 37) in his father; Colon polyps in his father; Diabetes in his mother.    ROS:  Please see the history of present illness. All other systems are reviewed and  Negative to the above problem except as noted.    PHYSICAL EXAM: VS:  BP 128/62   Pulse 82   Ht 5' 10 (1.778 m)   Wt 204 lb 12.8 oz (92.9 kg)   BMI 29.39 kg/m   GEN: Well nourished, well developed, in no acute distress  HEENT: normal  Neck: no JVD, carotid  bruits, or masses Cardiac: RRR; no murmurs, rubs, or gallops,no edema  Respiratory:  clear to auscultation bilaterally, normal work of breathing GI: soft, nontender, nondistended, + BS  No hepatomegaly  MS: no deformity Moving all extremities   Skin: warm and dry, no rash Neuro:  Strength and sensation are intact Psych: euthymic mood, full affect   EKG:  EKG is ordered today.  NSR 82 bpm   nonspecific ST changes    Lipid Panel No results found for: CHOL, TRIG, HDL, CHOLHDL, VLDL, LDLCALC, LDLDIRECT    Wt Readings from Last 3 Encounters:  06/18/24 204 lb 12.8 oz (92.9 kg)  12/25/23 200 lb (90.7 kg)  12/12/23 200 lb (90.7 kg)      ASSESSMENT AND PLAN:  1  Chest pain  2  HL   Pt on simvistatin 40 mg   LDL 86 in Jan 2025  HDL 47  Trig 102       3 DM  Pt on metformin, jardiance and insulin   Last A1C in Jan 2025 was 6.7        Current medicines are reviewed at length with the patient today.  The patient does not have concerns regarding medicines.  Signed, [name], MD  06/18/2024 3:20 PM    [facility] [address] [place], [place], [place]  [address] Phone: [phone]; Fax: [phone]

## 2024-06-18 NOTE — H&P (View-Only) (Signed)
 Cardiology Office Note   Date:  06/18/2024   ID:  Micheal Gretel Raddle., DOB 1960-11-28, MRN [id number]  PCP:  Fields Ade, MD  Cardiologist:   [name], MD    Pt presents for evaluation of CP     History of Present Illness: Micheal Patlan. is a 63 y.o. male with a history of T2DM (on Insulin ).  He is followed by Micheal Fields. CT calcium score in May 2025 Score was 243.    The pt is active   Works in yard, goes to gym At the end of Aug 2025 he had sugery on his arm and hand.  Placed on meloxicam and doxycycline.    Placed on Meloxicam and also Doxycycline    He stopped taking the doxycycline soon after starting      Over the past few weeks he has had epsiodes of chest pains   Occur only with exertion.   He will stop what he is doing and it will ease in a couple minutes    No episodes at rest. He can continue on with activity after      Denies SOB  No diaphoresis.  The pt was seen at [facility] by APP 2 wks ago   EKG done  BP 115/   Given Rx for PPI (he had been on omeprazole OTC prior)    The pt says he has a hx of GERD but this discomfort is different from his GERD pain   In the past 2 wks he has continued to have episodes of chest pain.  Unchanged    He was at [employer] game on Saturday  Was waking in parking lot   Got discomfort   Stopped   Discomfort eased   He continued on    He also had an epiosde of pain while mowing lawn.   Came into house   Took tsp of baking soda and water   Calmed down        The pt has a hx of T2DM   He has had an insulin  pump for about 10 years        Current Meds  Medication Sig   Cholecalciferol (D3 ADULT PO) Take 1,000 Units by mouth daily.   gabapentin (NEURONTIN) 100 MG capsule Take 100 mg by mouth daily.   glucose blood (ONETOUCH VERIO) test strip TEST 4 TIMES DAILY AS DIRECTED   Insulin  Disposable Pump (V-GO 30) KIT USE 1 DEVICE DAILY - 30 UNITS BASAL, UP TO 36 UNITS BOLUS PER DAY   insulin  lispro (HUMALOG) 100 UNIT/ML injection USE  VIA INSULIN  PUMP UP TO 100 UNITS PER DAY.   JARDIANCE 25 MG TABS tablet TK 1 T PO D   lisinopril (ZESTRIL) 20 MG tablet Take 20 mg by mouth 2 (two) times daily.   metFORMIN (GLUCOPHAGE) 500 MG tablet TK 1 T PO BID   ONE TOUCH ULTRA TEST test strip    pantoprazole (PROTONIX) 40 MG tablet Take 40 mg by mouth 2 (two) times daily.   simvastatin (ZOCOR) 40 MG tablet Take 40 mg by mouth daily.      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 He has quit using smokeless tobacco.  His smokeless tobacco use included chew. He reports current alcohol  use of about 6.0 standard drinks of alcohol  per week. He reports that he does not use drugs.   Family History:  The patient's family history includes Colon cancer (age of onset: 52) in his father; Colon polyps in his father; Diabetes in his mother.    ROS:  Please see the history of present illness. All other systems are reviewed and  Negative to the above problem except as noted.    PHYSICAL EXAM: VS:  BP 128/62   Pulse 82   Ht 5' 10 (1.778 m)   Wt 204 lb 12.8 oz (92.9 kg)   BMI 29.39 kg/m   GEN: Well nourished, well developed, in no acute distress  HEENT: normal  Neck: no JVD, carotid bruit Cardiac: RRR; no  murmurs, Respiratory:  clear to auscultation  GI: soft, nontender, no masses  No hepatomegaly  MS: no deformity Moving all extremities   No LE edema    EKG:  EKG is ordered today.  NSR 82 bpm   nonspecific ST changes    Lipid Panel No results found for: CHOL, TRIG, HDL, CHOLHDL, VLDL, LDLCALC, LDLDIRECT    Wt Readings from Last 3 Encounters:  06/18/24 204 lb 12.8 oz (92.9 kg)  12/25/23 200 lb (90.7 kg)  12/12/23 200 lb (90.7 kg)      ASSESSMENT AND PLAN:  1  Chest pain  Pt with exertional chest pain that has developed over the past several weeks   No pain at rest   Different from his symptoms of GERD   Hx of T2DM   Ca score 250s in May 2025 I have discussed with pt concerns that this is angina  WOuld recomm L heart cath to define anatomy Risks/benefits described   Pt understands and agrees to proceed Start 81 mg ecASA today     NTG prn    Avoid heavy exertion until cath   2  HL   Pt on simvistatin 40 mg  In Jan 2025   LDL 86  HDL 47  Trig 102    Will get fasting NMR panel     3 DM  Pt on metformin, jardiance and insulin   Last A1C in Jan 2025 was 6.7    Repeat A1C     Hold Jardiance, metformin on day of cath    Hold insulin  and follow   4  HTN  BP is well controlled       Current medicines are reviewed at length with the patient today.  The patient does not have concerns regarding medicines.  Signed, [name], MD  06/18/2024 3:20 PM    [facility] [address] [place], [place], [place]  [address] Phone: [phone]; Fax: [phone]

## 2024-06-18 NOTE — Patient Instructions (Signed)
 Medication Instructions:  Your physician recommends that you continue on your current medications as directed. Please refer to the Current Medication list given to you today.  *If you need a refill on your cardiac medications before your next appointment, please call your pharmacy*  Lab Work: TODAY: CBC, CMET, Hgb A1c At your convenience: fasting Lipid panel, NMR, LP(a), Apo B If you have labs (blood work) drawn today and your tests are completely normal, you will receive your results only by: MyChart Message (if you have MyChart) OR A paper copy in the mail If you have any lab test that is abnormal or we need to change your treatment, we will call you to review the results.  Testing/Procedures: Your physician has requested that you have a cardiac catheterization. Cardiac catheterization is used to diagnose and/or treat various heart conditions. Doctors may recommend this procedure for a number of different reasons. The most common reason is to evaluate chest pain. Chest pain can be a symptom of coronary artery disease (CAD), and cardiac catheterization can show whether plaque is narrowing or blocking your heart's arteries. This procedure is also used to evaluate the valves, as well as measure the blood flow and oxygen levels in different parts of your heart. For further information please visit [email or web address] Please follow instruction sheet, as given.  Follow-Up: At [facility], you and your health needs are our priority.  As part of our continuing mission to provide you with exceptional heart care, our providers are all part of one team.  This team includes your primary Cardiologist (physician) and Advanced Practice Providers or APPs (Physician Assistants and Nurse Practitioners) who all work together to provide you with the care you need, when you need it.  Your next appointment:   To be determined after cardiac cath  Provider:   [name], MD  We recommend signing up for  the patient portal called MyChart.  Sign up information is provided on this After Visit Summary.  MyChart is used to connect with patients for Virtual Visits (Telemedicine).  Patients are able to view lab/test results, encounter notes, upcoming appointments, etc.  Non-urgent messages can be sent to your provider as well.    To learn more about what you can do with MyChart, go to [email or web address].

## 2024-06-19 LAB — HEMOGLOBIN A1C
Est. average glucose Bld gHb Est-mCnc: 154 mg/dL
Hgb A1c MFr Bld: 7 % — ABNORMAL HIGH (ref 4.8–5.6)

## 2024-06-19 LAB — COMPREHENSIVE METABOLIC PANEL WITH GFR
ALT: 14 IU/L (ref 0–44)
AST: 15 IU/L (ref 0–40)
Albumin: 4.6 g/dL (ref 3.9–4.9)
Alkaline Phosphatase: 64 IU/L (ref 47–123)
BUN/Creatinine Ratio: 21 (ref 10–24)
BUN: 27 mg/dL (ref 8–27)
Bilirubin Total: 0.5 mg/dL (ref 0.0–1.2)
CO2: 22 mmol/L (ref 20–29)
Calcium: 9.8 mg/dL (ref 8.6–10.2)
Chloride: 99 mmol/L (ref 96–106)
Creatinine, Ser: 1.31 mg/dL — AB (ref 0.76–1.27)
Globulin, Total: 2.5 g/dL (ref 1.5–4.5)
Glucose: 132 mg/dL — AB (ref 70–99)
Potassium: 4.5 mmol/L (ref 3.5–5.2)
Sodium: 138 mmol/L (ref 134–144)
Total Protein: 7.1 g/dL (ref 6.0–8.5)
eGFR: 62 mL/min/1.73 (ref >59)

## 2024-06-19 LAB — CBC
Hematocrit: 45.2 % (ref 37.5–51.0)
Hemoglobin: 15.1 g/dL (ref 13.0–17.7)
MCH: 29.8 pg (ref 26.6–33.0)
MCHC: 33.4 g/dL (ref 31.5–35.7)
MCV: 89 fL (ref 79–97)
Platelets: 336 x10E3/uL (ref 150–450)
RBC: 5.06 x10E6/uL (ref 4.14–5.80)
RDW: 12.2 % (ref 11.6–15.4)
WBC: 8.8 x10E3/uL (ref 3.4–10.8)

## 2024-06-19 NOTE — Progress Notes (Incomplete)
 Cardiology Office Note   Date:  06/18/2024   ID:  Micheal Gretel Raddle., DOB 16-May-1961, MRN [id number]  PCP:  Shepard Ade, MD  Cardiologist:   [name], MD    Pt presents for evaluation of CP     History of Present Illness: Micheal Ardoin. is a 63 y.o. male with a history of T2DM (on Insulin ).  He is followed by [name]. CT calcium score in May 2025 Score was 243.    The pt is active   Works in yard, goes to gym At the end of Aug 2025 he had sugery on his arm and hand.  Placed on meloxicam and doxycycline.    Placed on Meloxicam and also Doxycycline    He stopped taking the doxycycline soon after starting      Over the past few weeks he has had epsiodes of chest pains   Occur only with exertion.   He will stop what he is doing and it will ease in a couple minutes    No episodes at rest. He can continue on with activity after      Denies SOB  No diaphoresis.  The pt was seen at [facility] by APP 2 wks ago   EKG done  BP 115/   Given Rx for PPI (he had been on omeprazole OTC prior)    The pt says he has a hx of GERD but this discomfort is different from his GERD pain   In the past 2 wks he has continued to have episodes of chest pain.  Unchanged    He was at [employer] game on Saturday  Was waking in parking lot   Got discomfort   Stopped   Discomfort eased   He continued on    He also had an epiosde of pain while mowing lawn.   Came into house   Took tsp of baking soda and water   Calmed down        The pt has a hx of T2DM   He has had an insulin  pump for about 10 years        Current Meds  Medication Sig  . Cholecalciferol (D3 ADULT PO) Take 1,000 Units by mouth daily.  SABRA gabapentin (NEURONTIN) 100 MG capsule Take 100 mg by mouth daily.  SABRA glucose blood (ONETOUCH VERIO) test strip TEST 4 TIMES DAILY AS DIRECTED  . Insulin  Disposable Pump (V-GO 30) KIT USE 1 DEVICE DAILY - 30 UNITS BASAL, UP TO 36 UNITS BOLUS PER DAY  . insulin  lispro (HUMALOG) 100 UNIT/ML injection  USE VIA INSULIN  PUMP UP TO 100 UNITS PER DAY.  SABRA JARDIANCE 25 MG TABS tablet TK 1 T PO D  . lisinopril (ZESTRIL) 20 MG tablet Take 20 mg by mouth 2 (two) times daily.  . metFORMIN (GLUCOPHAGE) 500 MG tablet TK 1 T PO BID  . ONE TOUCH ULTRA TEST test strip   . pantoprazole (PROTONIX) 40 MG tablet Take 40 mg by mouth 2 (two) times daily.  . simvastatin (ZOCOR) 40 MG tablet Take 40 mg by mouth daily.      Allergies:   Sulfa antibiotics   Past Medical History:  Diagnosis Date  . Allergy    mild   . Arthritis   . Cancer (HCC) 2025   Shoulder skin cancer  . Cataract 2022   Bilateral  . Diabetes mellitus without complication (HCC)   . GERD (gastroesophageal reflux disease)   . Hyperlipidemia  takes meds due to DM- no actual high cholesterol   . Hypertension    takes meds due to DM- no actual high BP   . 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 COLONOSCOPY    . ELBOW SURGERY     left elbow  . LAMINECTOMY     l 4-5  . TRIGGER FINGER RELEASE Right    middle finger  . UPPER GASTROINTESTINAL ENDOSCOPY    . WISDOM TOOTH EXTRACTION       Social History:  The patient  reports that he has never smoked. He has quit using smokeless tobacco.  His smokeless tobacco use included chew. He reports current alcohol  use of about 6.0 standard drinks of alcohol  per week. He reports that he does not use drugs.   Family History:  The patient's family history includes Colon cancer (age of onset: 37) in his father; Colon polyps in his father; Diabetes in his mother.    ROS:  Please see the history of present illness. All other systems are reviewed and  Negative to the above problem except as noted.    PHYSICAL EXAM: VS:  BP 128/62   Pulse 82   Ht 5' 10 (1.778 m)   Wt 204 lb 12.8 oz (92.9 kg)   BMI 29.39 kg/m   GEN: Well nourished, well developed, in no acute distress  HEENT: normal  Neck: no JVD, carotid  bruits, or masses Cardiac: RRR; no murmurs, rubs, or gallops,no edema  Respiratory:  clear to auscultation bilaterally, normal work of breathing GI: soft, nontender, nondistended, + BS  No hepatomegaly  MS: no deformity Moving all extremities   Skin: warm and dry, no rash Neuro:  Strength and sensation are intact Psych: euthymic mood, full affect   EKG:  EKG is ordered today.  NSR 82 bpm   nonspecific ST changes    Lipid Panel No results found for: CHOL, TRIG, HDL, CHOLHDL, VLDL, LDLCALC, LDLDIRECT    Wt Readings from Last 3 Encounters:  06/18/24 204 lb 12.8 oz (92.9 kg)  12/25/23 200 lb (90.7 kg)  12/12/23 200 lb (90.7 kg)      ASSESSMENT AND PLAN:  1  Chest pain  2  HL   Pt on simvistatin 40 mg   LDL 86 in Jan 2025  HDL 47  Trig 102       3 DM  Pt on metformin, jardiance and insulin   Last A1C in Jan 2025 was 6.7        Current medicines are reviewed at length with the patient today.  The patient does not have concerns regarding medicines.  Signed, [name], MD  06/18/2024 3:20 PM    [facility] [address] [place], [place], [place]  [address] Phone: [phone]; Fax: [phone]

## 2024-06-22 ENCOUNTER — Other Ambulatory Visit (HOSPITAL_COMMUNITY): Payer: Self-pay

## 2024-06-22 ENCOUNTER — Encounter (HOSPITAL_COMMUNITY)
Admission: RE | Disposition: A | Discharge status: Home / Self Care | Payer: Self-pay | Source: Home / Self Care | Attending: Cardiology

## 2024-06-22 ENCOUNTER — Other Ambulatory Visit: Payer: Self-pay

## 2024-06-22 ENCOUNTER — Ambulatory Visit (HOSPITAL_COMMUNITY)
Admission: RE | Admit: 2024-06-22 | Discharge: 2024-06-22 | Disposition: A | Discharge status: Home / Self Care | Attending: Cardiology | Admitting: Cardiology

## 2024-06-22 DIAGNOSIS — Z7984 Long term (current) use of oral hypoglycemic drugs: Secondary | ICD-10-CM | POA: Diagnosis not present

## 2024-06-22 DIAGNOSIS — I2510 Atherosclerotic heart disease of native coronary artery without angina pectoris: Secondary | ICD-10-CM | POA: Diagnosis not present

## 2024-06-22 DIAGNOSIS — I25119 Atherosclerotic heart disease of native coronary artery with unspecified angina pectoris: Secondary | ICD-10-CM | POA: Diagnosis not present

## 2024-06-22 DIAGNOSIS — K219 Gastro-esophageal reflux disease without esophagitis: Secondary | ICD-10-CM | POA: Insufficient documentation

## 2024-06-22 DIAGNOSIS — Z7982 Long term (current) use of aspirin: Secondary | ICD-10-CM | POA: Insufficient documentation

## 2024-06-22 DIAGNOSIS — E785 Hyperlipidemia, unspecified: Secondary | ICD-10-CM | POA: Insufficient documentation

## 2024-06-22 DIAGNOSIS — E119 Type 2 diabetes mellitus without complications: Secondary | ICD-10-CM | POA: Diagnosis not present

## 2024-06-22 DIAGNOSIS — Z9641 Presence of insulin pump (external) (internal): Secondary | ICD-10-CM | POA: Diagnosis not present

## 2024-06-22 DIAGNOSIS — Z79899 Other long term (current) drug therapy: Secondary | ICD-10-CM | POA: Diagnosis not present

## 2024-06-22 DIAGNOSIS — Z7902 Long term (current) use of antithrombotics/antiplatelets: Secondary | ICD-10-CM | POA: Insufficient documentation

## 2024-06-22 DIAGNOSIS — I10 Essential (primary) hypertension: Secondary | ICD-10-CM | POA: Diagnosis not present

## 2024-06-22 DIAGNOSIS — Z794 Long term (current) use of insulin: Secondary | ICD-10-CM | POA: Diagnosis not present

## 2024-06-22 DIAGNOSIS — Z9861 Coronary angioplasty status: Secondary | ICD-10-CM

## 2024-06-22 DIAGNOSIS — Z955 Presence of coronary angioplasty implant and graft: Secondary | ICD-10-CM

## 2024-06-22 DIAGNOSIS — E109 Type 1 diabetes mellitus without complications: Secondary | ICD-10-CM

## 2024-06-22 HISTORY — PX: CORONARY ULTRASOUND/IVUS: CATH118244

## 2024-06-22 HISTORY — PX: LEFT HEART CATH AND CORONARY ANGIOGRAPHY: CATH118249

## 2024-06-22 HISTORY — PX: CORONARY STENT INTERVENTION: CATH118234

## 2024-06-22 LAB — GLUCOSE, CAPILLARY
Glucose-Capillary: 97 mg/dL (ref 70–99)
Glucose-Capillary: 113 mg/dL — ABNORMAL HIGH (ref 70–99)

## 2024-06-22 LAB — POCT ACTIVATED CLOTTING TIME
Activated Clotting Time: 256 s
Activated Clotting Time: 273 s

## 2024-06-22 SURGERY — LEFT HEART CATH AND CORONARY ANGIOGRAPHY
Anesthesia: LOCAL

## 2024-06-22 MED ORDER — MIDAZOLAM HCL 2 MG/2ML IJ SOLN
INTRAMUSCULAR | Status: AC
  Filled 2024-06-22: qty 2

## 2024-06-22 MED ORDER — CLOPIDOGREL BISULFATE 75 MG PO TABS
75.0000 mg | ORAL_TABLET | Freq: Every day | ORAL | 1 refills | Status: DC
  Filled 2024-06-22: qty 90, 90d supply, fill #0

## 2024-06-22 MED ORDER — ASPIRIN 81 MG PO CHEW: 81.0000 mg | CHEWABLE_TABLET | ORAL | Status: DC

## 2024-06-22 MED ORDER — SODIUM CHLORIDE 0.9% FLUSH: 3.0000 mL | Freq: Two times a day (BID) | INTRAVENOUS | Status: DC

## 2024-06-22 MED ORDER — LIDOCAINE HCL (PF) 1 % IJ SOLN
INTRAMUSCULAR | Status: AC
  Filled 2024-06-22: qty 30

## 2024-06-22 MED ORDER — HEPARIN SODIUM (PORCINE) 1000 UNIT/ML IJ SOLN
INTRAMUSCULAR | Status: AC
  Filled 2024-06-22: qty 10

## 2024-06-22 MED ORDER — SODIUM CHLORIDE 0.9 % IV SOLN
INTRAVENOUS | Status: DC | PRN
  Administered 2024-06-22: 250 mL via INTRAVENOUS

## 2024-06-22 MED ORDER — AMLODIPINE BESYLATE 5 MG PO TABS
5.0000 mg | ORAL_TABLET | Freq: Every day | ORAL | 2 refills | Status: DC
  Filled 2024-06-22: qty 30, 30d supply, fill #0

## 2024-06-22 MED ORDER — SODIUM CHLORIDE 0.9 % IV SOLN: 250.0000 mL | INTRAVENOUS | Status: DC | PRN

## 2024-06-22 MED ORDER — CLOPIDOGREL BISULFATE 300 MG PO TABS
ORAL_TABLET | ORAL | Status: AC
Start: 2024-06-22 — End: 2024-06-22
  Filled 2024-06-22: qty 2

## 2024-06-22 MED ORDER — ONDANSETRON HCL 4 MG/2ML IJ SOLN: 4.0000 mg | Freq: Four times a day (QID) | INTRAMUSCULAR | Status: DC | PRN

## 2024-06-22 MED ORDER — ATORVASTATIN CALCIUM 80 MG PO TABS
80.0000 mg | ORAL_TABLET | Freq: Every day | ORAL | 2 refills | Status: DC
  Filled 2024-06-22: qty 30, 30d supply, fill #0

## 2024-06-22 MED ORDER — SODIUM CHLORIDE 0.9% FLUSH: 3.0000 mL | INTRAVENOUS | Status: DC | PRN

## 2024-06-22 MED ORDER — HEPARIN SODIUM (PORCINE) 1000 UNIT/ML IJ SOLN
INTRAMUSCULAR | Status: DC | PRN
Start: 2024-06-22 — End: 2024-06-22
  Administered 2024-06-22: 3000 [IU] via INTRAVENOUS
  Administered 2024-06-22: 4500 [IU] via INTRAVENOUS
  Administered 2024-06-22: 6000 [IU] via INTRAVENOUS

## 2024-06-22 MED ORDER — HYDRALAZINE HCL 20 MG/ML IJ SOLN: 5.0000 mg | INTRAMUSCULAR | Status: AC | PRN

## 2024-06-22 MED ORDER — HEPARIN (PORCINE) IN NACL 1000-0.9 UT/500ML-% IV SOLN
INTRAVENOUS | Status: DC | PRN
  Administered 2024-06-22: 1000 mL

## 2024-06-22 MED ORDER — ASPIRIN 81 MG PO TBEC
81.0000 mg | DELAYED_RELEASE_TABLET | Freq: Every day | ORAL | 3 refills | Status: AC
  Filled 2024-06-22: qty 90, 90d supply, fill #0

## 2024-06-22 MED ORDER — IOHEXOL 350 MG/ML SOLN
INTRAVENOUS | Status: DC | PRN
  Administered 2024-06-22: 170 mL

## 2024-06-22 MED ORDER — ACETAMINOPHEN 325 MG PO TABS: 650.0000 mg | ORAL_TABLET | ORAL | Status: DC | PRN

## 2024-06-22 MED ORDER — VERAPAMIL HCL 2.5 MG/ML IV SOLN
INTRAVENOUS | Status: AC
  Filled 2024-06-22: qty 2

## 2024-06-22 MED ORDER — LIDOCAINE HCL (PF) 1 % IJ SOLN
INTRAMUSCULAR | Status: DC | PRN
  Administered 2024-06-22: 2 mL via INTRADERMAL

## 2024-06-22 MED ORDER — NITROGLYCERIN 1 MG/10 ML FOR IR/CATH LAB
INTRA_ARTERIAL | Status: AC
  Filled 2024-06-22: qty 10

## 2024-06-22 MED ORDER — FREE WATER: 500.0000 mL | Freq: Once | Status: DC

## 2024-06-22 MED ORDER — HEPARIN (PORCINE) IN NACL 2-0.9 UNITS/ML
INTRAMUSCULAR | Status: DC | PRN
  Administered 2024-06-22: 10 mL via INTRA_ARTERIAL

## 2024-06-22 MED ORDER — LABETALOL HCL 5 MG/ML IV SOLN: 10.0000 mg | INTRAVENOUS | Status: AC | PRN

## 2024-06-22 MED ORDER — NITROGLYCERIN 1 MG/10 ML FOR IR/CATH LAB
INTRA_ARTERIAL | Status: DC | PRN
  Administered 2024-06-22: 200 ug via INTRACORONARY
  Administered 2024-06-22: 100 ug via INTRACORONARY

## 2024-06-22 MED ORDER — ANGIOPLASTY BOOK
Status: AC
  Filled 2024-06-22: qty 1

## 2024-06-22 MED ORDER — CLOPIDOGREL BISULFATE 300 MG PO TABS
ORAL_TABLET | ORAL | Status: DC | PRN
  Administered 2024-06-22: 600 mg via ORAL

## 2024-06-22 MED ORDER — FENTANYL CITRATE (PF) 100 MCG/2ML IJ SOLN
INTRAMUSCULAR | Status: DC | PRN
  Administered 2024-06-22: 25 ug via INTRAVENOUS

## 2024-06-22 MED ORDER — FENTANYL CITRATE (PF) 100 MCG/2ML IJ SOLN
INTRAMUSCULAR | Status: AC
  Filled 2024-06-22: qty 2

## 2024-06-22 MED ORDER — MIDAZOLAM HCL 2 MG/2ML IJ SOLN
INTRAMUSCULAR | Status: DC | PRN
  Administered 2024-06-22: 2 mg via INTRAVENOUS

## 2024-06-22 SURGICAL SUPPLY — 16 items
BALLOON SAPPHIRE NC24 3.0X12 (BALLOONS) IMPLANT
BALLOON SCOREFLEX 2.50X15 (BALLOONS) IMPLANT
CATH 5FR JL3.5 JR4 ANG PIG MP (CATHETERS) IMPLANT
CATH OPTICROSS HD (CATHETERS) IMPLANT
CATH VISTA GUIDE 6FR XBLD 3.5 (CATHETERS) IMPLANT
DEVICE RAD COMP TR BAND LRG (VASCULAR PRODUCTS) IMPLANT
DRAPE IVUS SLED (BAG) IMPLANT
GLIDESHEATH SLEND SS 6F .021 (SHEATH) IMPLANT
GUIDEWIRE INQWIRE 1.5J.035X260 (WIRE) IMPLANT
KIT ENCORE 26 ADVANTAGE (KITS) IMPLANT
KIT HEMO VALVE WATCHDOG (MISCELLANEOUS) IMPLANT
KIT SYRINGE INJ CVI SPIKEX1 (MISCELLANEOUS) IMPLANT
PACK CARDIAC CATHETERIZATION (CUSTOM PROCEDURE TRAY) ×1 IMPLANT
SET ATX-X65L (MISCELLANEOUS) IMPLANT
STENT SYNERGY XD 2.50X24 (Permanent Stent) IMPLANT
WIRE RUNTHROUGH .014X180CM (WIRE) IMPLANT

## 2024-06-22 NOTE — Discharge Summary (Signed)
 Discharge Summary for Same Day PCI   Patient ID: Micheal Fields. MRN: [id number]; DOB: February 19, 1961  Admit date: 06/22/2024 Discharge date: 06/22/2024  Primary Care Provider: Shepard Ade, MD  Primary Cardiologist: [name], MD  Primary Electrophysiologist:  None   Discharge Diagnoses    Principal Problem:   CAD S/P percutaneous coronary angioplasty Active Problems:   Hypertension   Hyperlipidemia with target low density lipoprotein (LDL) cholesterol less than 55 mg/dL   DM (diabetes mellitus) (HCC)    Diagnostic Studies/Procedures    Cardiac Catheterization 06/22/2024:  Cardiac Catheterization 06/22/24: Hemodynamic data: LV: 97/5, EDP 15 mmHg.  Ao 106/54, mean 77 mmHg.  No pressure gradient across the aortic valve.   Angiographic data: LV: Normal LV systolic function, EF 55 to 60%.  No wall motion abnormality. RCA: Nondominant, normal. LM: Large-caliber vessel, smooth and normal. LAD: Moderate caliber vessel, proximal LAD with eccentric 95 to 99% stenosis.  Very large D1 almost LAD equivalent with proximal 20 to 30% stenosis.  Mid LAD is mildly diseased. LCx: Very large caliber vessel, large OM 3 and gives origin to a large PDA and 2 large PL branches.  Smooth and normal. RI: Large-caliber vessel giving the secondary branch.  Smooth and normal.   Intervention data: IVUS guided PCI to proximal LAD with implantation of a 2.5 x 24 mm Synergy XD DES optimized proximally with a 3 mm [place] balloon at 20 atmospheric pressure, lumen size improved from MLA 2 mm to 5.2 mm.  Excellent stent apposition and no edge dissection evident by IVUS. Significant mid LAD spasm treated with IC nitroglycerin.         Impression and recommendations: Patient has diffuse disease in the LAD by IVUS, needs aggressive risk modification.  Simvastatin discontinued and started on atorvastatin 80 mg daily, aspirin indefinitely, Plavix for 6 months.  Also started amlodipine 5 mg daily for  vasodilatory effect.  Outpatient follow-up after discharge. _____________   History of Present Illness     Micheal Fields. is a 63 y.o. male with DM2 on insulin , elevated CT calcium score, and CAD.  He was seen by cardiology for chest pain concerning for angina.   Cardiac catheterization was arranged for further evaluation.  Hospital Course     The patient underwent cardiac cath as noted above with 99% proximal LAD stenosis successfully treated 2.5 x 24 mm DES. Plan for DAPT with ASA/plavix for at least 6 months. The patient was seen by cardiac rehab while in short stay. There were no observed complications post cath. Radial cath site was re-evaluated prior to discharge and found to be stable without any complications. Instructions/precautions regarding cath site care were given prior to discharge.  [name] was seen by Dr. Ladona and determined stable for discharge home. Follow up with our office has been arranged. Medications are listed below. Pertinent changes include new plavix.   Anti-hypertensive regimen was adjusted to include 5 mg amlodipine. Lisinopril was continued. Zocor was transitioned to 80 mg lipitor.      _____________  Cath/PCI Registry Performance & Quality Measures: Aspirin prescribed? - Yes ADP Receptor Inhibitor (Plavix/Clopidogrel, Brilinta/Ticagrelor or Effient/Prasugrel) prescribed (includes medically managed patients)? - Yes High Intensity Statin (Lipitor 40-80mg  or Crestor 20-40mg ) prescribed? - Yes For EF <40%, was ACEI/ARB prescribed? - Yes For EF <40%, Aldosterone Antagonist (Spironolactone or Eplerenone) prescribed? - Not Applicable (EF >/= 40%) Cardiac Rehab Phase II ordered (Included Medically managed Patients)? - Yes  _____________   Discharge Vitals Blood pressure 109/64,  pulse 69, temperature 98.2 F (36.8 C), temperature source Oral, resp. rate 13, height 5' 10 (1.778 m), weight 91.6 kg, SpO2 100%.  Filed Weights   06/22/24 0607   Weight: 91.6 kg    Last Labs & Radiologic Studies    CBC No results for input(s): WBC, NEUTROABS, HGB, HCT, MCV, PLT in the last 72 hours. Basic Metabolic Panel No results for input(s): NA, K, CL, CO2, GLUCOSE, BUN, CREATININE, CALCIUM, MG, PHOS in the last 72 hours. Liver Function Tests No results for input(s): AST, ALT, ALKPHOS, BILITOT, PROT, ALBUMIN in the last 72 hours. No results for input(s): LIPASE, AMYLASE in the last 72 hours. High Sensitivity Troponin:   No results for input(s): TROPONINIHS in the last 720 hours.  BNP Invalid input(s): POCBNP D-Dimer No results for input(s): DDIMER in the last 72 hours. Hemoglobin A1C No results for input(s): HGBA1C in the last 72 hours. Fasting Lipid Panel No results for input(s): CHOL, HDL, LDLCALC, TRIG, CHOLHDL, LDLDIRECT in the last 72 hours. Thyroid Function Tests No results for input(s): TSH, T4TOTAL, T3FREE, THYROIDAB in the last 72 hours.  Invalid input(s): FREET3 _____________  CARDIAC CATHETERIZATION Result Date: 06/22/2024 Images from the original result were not included. Cardiac Catheterization 06/22/24: Hemodynamic data: LV: 97/5, EDP 15 mmHg.  Ao 106/54, mean 77 mmHg.  No pressure gradient across the aortic valve. Angiographic data: LV: Normal LV systolic function, EF 55 to 60%.  No wall motion abnormality. RCA: Nondominant, normal. LM: Large-caliber vessel, smooth and normal. LAD: Moderate caliber vessel, proximal LAD with eccentric 95 to 99% stenosis.  Very large D1 almost LAD equivalent with proximal 20 to 30% stenosis.  Mid LAD is mildly diseased. LCx: Very large caliber vessel, large OM 3 and gives origin to a large PDA and 2 large PL branches.  Smooth and normal. RI: Large-caliber vessel giving the secondary branch.  Smooth and normal. Intervention data: IVUS guided PCI to proximal LAD with implantation of a 2.5 x 24 mm Synergy XD DES  optimized proximally with a 3 mm [place] balloon at 20 atmospheric pressure, lumen size improved from MLA 2 mm to 5.2 mm.  Excellent stent apposition and no edge dissection evident by IVUS. Significant mid LAD spasm treated with IC nitroglycerin.    Impression and recommendations: Patient has diffuse disease in the LAD by IVUS, needs aggressive risk modification.  Simvastatin discontinued and started on atorvastatin 80 mg daily, aspirin indefinitely, Plavix for 6 months.  Also started amlodipine 5 mg daily for vasodilatory effect.  Outpatient follow-up after discharge.    Disposition   Pt is being discharged home today in good condition.  Follow-up Plans & Appointments     Discharge Instructions     AMB Referral to Cardiac Rehabilitation - Phase II   Complete by: As directed    Diagnosis: Coronary Stents   After initial evaluation and assessments completed: Virtual Based Care may be provided alone or in conjunction with Phase 2 Cardiac Rehab based on patient barriers.: Yes   Intensive Cardiac Rehabilitation (ICR) [facility] location only OR Traditional Cardiac Rehabilitation (TCR) *If criteria for ICR are not met will enroll in TCR [facility] only): Yes        Discharge Medications   Allergies as of 06/22/2024       Reactions   Sulfa Antibiotics Anaphylaxis   As a infant   Rosuvastatin Other (See Comments)   Sore shoulder/ ache        Medication List     PAUSE taking these  medications    metFORMIN 500 MG tablet Wait to take this until: June 25, 2024 Commonly known as: GLUCOPHAGE Take 500 mg by mouth 2 (two) times daily with a meal.       STOP taking these medications    simvastatin 40 MG tablet Commonly known as: ZOCOR       TAKE these medications    amLODipine 5 MG tablet Commonly known as: NORVASC Take 1 tablet (5 mg total) by mouth daily.   aspirin EC 81 MG tablet Take 1 tablet (81 mg total) by mouth daily. Swallow whole.   atorvastatin 80 MG tablet Commonly  known as: LIPITOR Take 1 tablet (80 mg total) by mouth daily.   Cholecalciferol 25 MCG (1000 UT) Chew Chew 1,000 Units by mouth daily.   clopidogrel 75 MG tablet Commonly known as: PLAVIX Take 1 tablet (75 mg total) by mouth daily.   gabapentin 100 MG capsule Commonly known as: NEURONTIN Take 100 mg by mouth at bedtime.   Jardiance 25 MG Tabs tablet Generic drug: empagliflozin Take 25 mg by mouth in the morning.   lisinopril 20 MG tablet Commonly known as: ZESTRIL Take 20 mg by mouth 2 (two) times daily.   nitroGLYCERIN 0.4 MG SL tablet Commonly known as: NITROSTAT Place 1 tablet (0.4 mg total) under the tongue every 5 (five) minutes as needed for chest pain.   NovoLOG 100 UNIT/ML injection Generic drug: insulin  aspart Inject into the skin. Insulin  pump   ONE TOUCH ULTRA TEST test strip Generic drug: glucose blood   OneTouch Verio test strip Generic drug: glucose blood TEST 4 TIMES DAILY AS DIRECTED   pantoprazole 40 MG tablet Commonly known as: PROTONIX Take 40 mg by mouth 2 (two) times daily.   PROBIOTIC PO Take 1 capsule by mouth daily. Digestive   V-Go 30 Kit USE 1 DEVICE DAILY - 30 UNITS BASAL, UP TO 36 UNITS BOLUS PER DAY   VITAMIN B-12 CR PO Take 1,000 mcg by mouth daily.           Allergies Allergies  Allergen Reactions   Sulfa Antibiotics Anaphylaxis    As a infant   Rosuvastatin Other (See Comments)    Sore shoulder/ ache    Outstanding Labs/Studies     Duration of Discharge Encounter   Greater than 30 minutes including physician time.  Signed, Jon Garre Montavius Subramaniam, PA 06/22/2024, 11:06 AM

## 2024-06-22 NOTE — Interval H&P Note (Signed)
 History and Physical Interval Note:  06/22/2024 7:48 AM  Micheal Fields.  has presented today for surgery, with the diagnosis of Angina.  The various methods of treatment have been discussed with the patient and family. After consideration of risks, benefits and other options for treatment, the patient has consented to  Procedure(s): LEFT HEART CATH AND CORONARY ANGIOGRAPHY (N/A) and coronary angioplasty as a surgical intervention.  The patient's history has been reviewed, patient examined, no change in status, stable for surgery.  I have reviewed the patient's chart and labs.  Questions were answered to the patient's satisfaction.     Micheal Fields

## 2024-06-22 NOTE — Research (Signed)
 [employer] Informed Consent   Subject Name: Micheal Fields.  Subject met inclusion and exclusion criteria.  The informed consent form, study requirements and expectations were reviewed with the subject and questions and concerns were addressed prior to the signing of the consent form.  The subject verbalized understanding of the trail requirements.  The subject agreed to participate in the [employer] trial and signed the informed consent.  The informed consent was obtained prior to performance of any protocol-specific procedures for the subject.  A copy of the signed informed consent was given to the subject and a copy was placed in the subject's medical record.  NORITA IHA D 06/22/2024, [phone]

## 2024-06-22 NOTE — Progress Notes (Signed)
 Discussed with pt and wife stent, restrictions, Plavix importance, diet, exercise, NTG and CRPII. Pt receptive with many questions. Will refer to [place] CRPII.  [id number] Aliene Aris BS, ACSM-CEP 06/22/2024 12:28 PM

## 2024-06-23 ENCOUNTER — Encounter (HOSPITAL_COMMUNITY): Payer: Self-pay | Admitting: Cardiology

## 2024-06-23 ENCOUNTER — Ambulatory Visit: Payer: Self-pay | Admitting: Internal Medicine

## 2024-06-23 ENCOUNTER — Encounter: Payer: Self-pay | Admitting: Internal Medicine

## 2024-06-23 DIAGNOSIS — I2510 Atherosclerotic heart disease of native coronary artery without angina pectoris: Secondary | ICD-10-CM

## 2024-06-23 DIAGNOSIS — E785 Hyperlipidemia, unspecified: Secondary | ICD-10-CM

## 2024-06-23 NOTE — Telephone Encounter (Signed)
 Addressed by Dr. Okey, see other MyChart message.

## 2024-06-23 NOTE — Telephone Encounter (Signed)
 Reviewed with pt   I do not think chest swelling is anything to worry about    Very subtle

## 2024-06-25 ENCOUNTER — Telehealth (HOSPITAL_COMMUNITY): Payer: Self-pay

## 2024-06-25 NOTE — Telephone Encounter (Signed)
 Pt insurance is active and benefits verified through [employer] Co-pay 0, DED $750/$750 met, out of pocket $3,000/$3,000 met, co-insurance 20%. no pre-authorization required, [employer] 06/25/2024@2 :18, REF# [id number]   TCR/ICR? ICR Visit(date of service)limitation? No limit Can multiple codes be used on the same date of service/visit?(IF ITS A LIMIT) n/a    Is this a lifetime maximum or an annual maximum? annual Has the member used any of these services to date? no Is there a time limit (weeks/months) on start of program and/or program completion? no   Will contact patient to see if he is interested in the Cardiac Rehab Program. If interested, patient will need to complete follow up appt. Once completed, patient will be contacted for scheduling upon review by the RN Navigator.

## 2024-06-25 NOTE — Telephone Encounter (Signed)
 Called patient to see if he is interested in the Cardiac Rehab Program. Patient expressed interest. Explained scheduling process and went over insurance, patient verbalized understanding. Will contact patient for scheduling once f/u has been completed.

## 2024-06-30 ENCOUNTER — Telehealth: Payer: Self-pay

## 2024-06-30 ENCOUNTER — Encounter: Payer: Self-pay | Admitting: *Deleted

## 2024-06-30 DIAGNOSIS — I2510 Atherosclerotic heart disease of native coronary artery without angina pectoris: Secondary | ICD-10-CM | POA: Diagnosis not present

## 2024-06-30 DIAGNOSIS — E785 Hyperlipidemia, unspecified: Secondary | ICD-10-CM | POA: Diagnosis not present

## 2024-06-30 DIAGNOSIS — Z9861 Coronary angioplasty status: Secondary | ICD-10-CM | POA: Diagnosis not present

## 2024-06-30 NOTE — Telephone Encounter (Signed)
 Patient needs a clearance note to do PT on his recent hand surgery.  Patient waiting in lobby in Zone [address] ib 5th.  06-30-24 VB

## 2024-06-30 NOTE — Telephone Encounter (Signed)
 Clearance note for PT given to the patient.

## 2024-07-01 DIAGNOSIS — S6412XD Injury of median nerve at wrist and hand level of left arm, subsequent encounter: Secondary | ICD-10-CM | POA: Diagnosis not present

## 2024-07-01 DIAGNOSIS — M720 Palmar fascial fibromatosis [Dupuytren]: Secondary | ICD-10-CM | POA: Diagnosis not present

## 2024-07-01 DIAGNOSIS — S5402XD Injury of ulnar nerve at forearm level, left arm, subsequent encounter: Secondary | ICD-10-CM | POA: Diagnosis not present

## 2024-07-07 ENCOUNTER — Ambulatory Visit: Admitting: Internal Medicine

## 2024-07-07 NOTE — Progress Notes (Deleted)
 Cardiology Office Note:  .   Date:  07/07/2024  ID:  Micheal Fields., DOB March 02, 1961, MRN [id number] PCP: Shepard Ade, MD  [facility] Providers Cardiologist:  [name], MD {    History of Present Illness: .   Micheal Fields. is a 63 y.o. male  with PMHx of CAD, Insulin  dependent DM2 who reports to [facility] office for follow up.   Last seen in heartcare OV 06/18/2024 by Dr. Gull for new patient evaluation of chest pain. Reviewed CAC score of 250 in 01/2024. Reported exertional chest pain concerning for angina. Started ASA and NTG PRN. Underwent outpatient cath 06/22/2024, which showed stenosis of prox LAD 95-99% s/p PCI w/ DES x1, D1 30%, mid LAD 30% w/ spasm treated w/ IC NTG. Recommended medical mgmt. D/C simvastatin. Started Lipitor 80 mg, ASA indefinitely, Plavix x 6 months, and Amlodipine 5 mg daily for vasodilatory effect. Continued on Lisinopril 20 mg BID.   Radial cath site stable w/o any complications???  Today, reports ### and denies ###.  Reports compliance with medications.  Dietary habitats:  Activity level:  Social: Denies tobacco use/alcohol /drug use  Denies any recent hospitalizations or visits to the emergency department.   ROS: 10 point review of system has been reviewed and considered negative except ones been listed in the HPI.   Studies Reviewed: .   Cardiac Catheterization 06/22/24: Hemodynamic data: LV: 97/5, EDP 15 mmHg.  Ao 106/54, mean 77 mmHg.  No pressure gradient across the aortic valve.   Angiographic data: LV: Normal LV systolic function, EF 55 to 60%.  No wall motion abnormality. RCA: Nondominant, normal. LM: Large-caliber vessel, smooth and normal. LAD: Moderate caliber vessel, proximal LAD with eccentric 95 to 99% stenosis.  Very large D1 almost LAD equivalent with proximal 20 to 30% stenosis.  Mid LAD is mildly diseased. LCx: Very large caliber vessel, large OM 3 and gives origin to a large PDA and 2 large PL branches.  Smooth and  normal. RI: Large-caliber vessel giving the secondary branch.  Smooth and normal.   Intervention data: IVUS guided PCI to proximal LAD with implantation of a 2.5 x 24 mm Synergy XD DES optimized proximally with a 3 mm [place] balloon at 20 atmospheric pressure, lumen size improved from MLA 2 mm to 5.2 mm.  Excellent stent apposition and no edge dissection evident by IVUS. Significant mid LAD spasm treated with IC nitroglycerin.         Impression and recommendations: Patient has diffuse disease in the LAD by IVUS, needs aggressive risk modification.  Simvastatin discontinued and started on atorvastatin 80 mg daily, aspirin indefinitely, Plavix for 6 months.  Also started amlodipine 5 mg daily for vasodilatory effect.  Outpatient follow-up after discharge. Risk Assessment/Calculations:   {Does this patient have ATRIAL FIBRILLATION?[phone]} No BP recorded.  {Refresh Note OR Click here to enter BP  :1}***       Physical Exam:   VS:  There were no vitals taken for this visit.   Wt Readings from Last 3 Encounters:  06/22/24 202 lb (91.6 kg)  06/18/24 204 lb 12.8 oz (92.9 kg)  12/25/23 200 lb (90.7 kg)    GEN: Well nourished, well developed in no acute distress while sitting in chair.  NECK: No JVD; No carotid bruits CARDIAC: ***RRR, no murmurs, rubs, gallops RESPIRATORY:  Clear to auscultation without rales, wheezing or rhonchi  ABDOMEN: Soft, non-tender, non-distended EXTREMITIES:  No edema; No deformity   ASSESSMENT AND PLAN: .   *** {  The patient has an active order for outpatient cardiac rehabilitation.   Please indicate if the patient is ready to start. Do NOT delete this.  It will auto delete.  Refresh note, then sign.              Click here to document readiness and see contraindications.  :1}  Cardiac Rehabilitation Eligibility Assessment      {Are you ordering a CV Procedure (e.g. stress test, cath, DCCV, TEE, etc)?   Press F2        :[id number]}  Dispo:  ***  Signed, Lorette CINDERELLA Kapur, PA-C

## 2024-07-08 ENCOUNTER — Ambulatory Visit: Admitting: General Practice

## 2024-07-08 ENCOUNTER — Ambulatory Visit: Admitting: Internal Medicine

## 2024-07-09 ENCOUNTER — Telehealth (HOSPITAL_COMMUNITY): Payer: Self-pay | Admitting: *Deleted

## 2024-07-09 MED ORDER — ATORVASTATIN CALCIUM 40 MG PO TABS: 40.0000 mg | ORAL_TABLET | Freq: Every day | ORAL | Status: DC

## 2024-07-09 NOTE — Telephone Encounter (Signed)
 Left message to call cardiac rehab.Hadassah Elpidio Quan RN BSN

## 2024-07-10 ENCOUNTER — Telehealth: Payer: Self-pay | Admitting: Internal Medicine

## 2024-07-10 LAB — NMR, LIPOPROFILE
Cholesterol, Total: 136 mg/dL (ref 100–199)
HDL Particle Number: 37.9 umol/L (ref >=30.5)
HDL-C: 51 mg/dL (ref >39)
LDL Particle Number: 1005 nmol/L — ABNORMAL HIGH (ref <1000)
LDL Size: 21 nm (ref >20.5)
LDL-C (NIH Calc): 70 mg/dL (ref 0–99)
LP-IR Score: 41 (ref <=45)
Small LDL Particle Number: 469 nmol/L (ref <=527)
Triglycerides: 74 mg/dL (ref 0–149)

## 2024-07-10 LAB — LIPOPROTEIN A (LPA): Lipoprotein (a): 38 nmol/L (ref <75.0)

## 2024-07-10 LAB — VITAMIN D 1,25 DIHYDROXY
Vitamin D 1, 25 (OH)2 Total: 20 pg/mL — ABNORMAL LOW
Vitamin D2 1, 25 (OH)2: <10 pg/mL
Vitamin D3 1, 25 (OH)2: 19 pg/mL

## 2024-07-10 LAB — URIC ACID: Uric Acid: 8 mg/dL (ref 3.8–8.4)

## 2024-07-10 LAB — APOLIPOPROTEIN B: Apolipoprotein B: 65 mg/dL (ref <90)

## 2024-07-10 LAB — TSH: TSH: 2.53 u[IU]/mL (ref 0.450–4.500)

## 2024-07-10 LAB — C-REACTIVE PROTEIN: CRP: 4 mg/L (ref 0–10)

## 2024-07-10 NOTE — Telephone Encounter (Signed)
   Pre-operative Risk Assessment    Patient Name: Micheal Fields.  DOB: 05/23/1961 MRN: [id number]   Date of last office visit: 06/18/24 Date of next office visit: TBD   Request for Surgical Clearance    Procedure:  indirect pulp filling   Date of Surgery:  Clearance 07/10/24                                Surgeon:  Dr. Arlin Piano Surgeon's Group or Practice Name:  [facility]  Phone number:  [phone] Fax number:  [phone]   Type of Clearance Requested:   - Medical    Type of Anesthesia:  Local    Additional requests/questions:    [name]   07/10/2024, 9:44 AM

## 2024-07-10 NOTE — Telephone Encounter (Signed)
 I called [facility] and their AI system came on. I left message with the AI system which states will forward message to the [facility]. AI system states taking messages due to their office is closed today for Halloween and will re-open on Monday 07/13/24.    I will also fax these notes to [facility]

## 2024-07-10 NOTE — Telephone Encounter (Signed)
 Dr. Sheryn is returning call--she requests a call back on Monday, 11/03 around 12:00 PM if possible. Please advise.

## 2024-07-10 NOTE — Telephone Encounter (Signed)
 We recommend that he postpone elective dental procedures for 6 months post stent placement. If procedure is urgent, would recommend that he continue on dual anti-platelet therapy of Plavix and aspirin without interruption during the procedural period.   Rosaline EMERSON Bane, NP-C  07/10/2024, 10:17 AM [address] [place], [place] [address] Office [phone] Fax [phone]

## 2024-07-13 ENCOUNTER — Telehealth: Payer: Self-pay

## 2024-07-13 DIAGNOSIS — M720 Palmar fascial fibromatosis [Dupuytren]: Secondary | ICD-10-CM | POA: Diagnosis not present

## 2024-07-13 DIAGNOSIS — S5402XD Injury of ulnar nerve at forearm level, left arm, subsequent encounter: Secondary | ICD-10-CM | POA: Diagnosis not present

## 2024-07-13 DIAGNOSIS — S6412XD Injury of median nerve at wrist and hand level of left arm, subsequent encounter: Secondary | ICD-10-CM | POA: Diagnosis not present

## 2024-07-13 NOTE — Telephone Encounter (Signed)
 Returned call to Dr. Arie office. Left voicemail that it has been recommended by Pre-op APP that patient postpone elective dental procedures for 6 months post stent placement. If procedure is urgent, would recommend that he continue with dual anti-platelet therapy of Plavix and Aspirin without interruption during the procedural period.

## 2024-07-13 NOTE — Telephone Encounter (Signed)
   Pre-operative Risk Assessment    Patient Name: Micheal Fields.  DOB: 12/18/1960 MRN: [id number]   Date of last office visit: 06/18/24 Date of next office visit: NA  Request for Surgical Clearance    Procedure:  3-Surface Filling (Indirect Pulp Cap on Upper Left Molar): SIMPLE  Date of Surgery:  Clearance TBD                               Surgeon:  Arlin Piano Surgeon's Group or Practice Name:  [facility] Phone number:  [phone] Fax number:  [phone] attn: Rianna   Type of Clearance Requested:   - Medical  - Pharmacy:  Hold Aspirin and Clopidogrel (Plavix)      Type of Anesthesia:  Local  (with epinephrine 2% Lidocaine with 1:100,000epi)   Additional requests/questions:    Micheal Fields   07/13/2024, 12:42 PM

## 2024-07-13 NOTE — Telephone Encounter (Signed)
 Tried to return call the [facility]. I got the AI system again that took a message.    I will send these notes to DR. Dand to see the notes from the preop APP Rosaline Bane, NP.

## 2024-07-13 NOTE — Telephone Encounter (Signed)
 We recommend that he postpone elective dental procedures for 6 months post stent placement. If procedure is urgent, would recommend that he continue on dual anti-platelet therapy of Plavix and aspirin without interruption during the procedural period.     Rosaline EMERSON Bane, NP-C  07/13/2024, 2:02 PM [address], [address] [place], [place] [address] Office [phone] Fax [phone]

## 2024-07-16 NOTE — Telephone Encounter (Signed)
 Will fax notes again to requesting office to be sure they have received the notes. Will ask if their office can call our office back and let us  know they received the notes and recommendations.

## 2024-07-20 MED ORDER — AMLODIPINE BESYLATE 5 MG PO TABS: 5.0000 mg | ORAL_TABLET | Freq: Every day | ORAL | 3 refills | Status: AC

## 2024-07-20 MED ORDER — ATORVASTATIN CALCIUM 40 MG PO TABS: 40.0000 mg | ORAL_TABLET | Freq: Every day | ORAL | 3 refills | Status: AC

## 2024-07-21 DIAGNOSIS — S6412XD Injury of median nerve at wrist and hand level of left arm, subsequent encounter: Secondary | ICD-10-CM | POA: Diagnosis not present

## 2024-07-21 DIAGNOSIS — M720 Palmar fascial fibromatosis [Dupuytren]: Secondary | ICD-10-CM | POA: Diagnosis not present

## 2024-07-21 DIAGNOSIS — L821 Other seborrheic keratosis: Secondary | ICD-10-CM | POA: Diagnosis not present

## 2024-07-21 DIAGNOSIS — L820 Inflamed seborrheic keratosis: Secondary | ICD-10-CM | POA: Diagnosis not present

## 2024-07-21 DIAGNOSIS — S5402XD Injury of ulnar nerve at forearm level, left arm, subsequent encounter: Secondary | ICD-10-CM | POA: Diagnosis not present

## 2024-08-05 ENCOUNTER — Encounter: Payer: Self-pay | Admitting: *Deleted

## 2024-08-05 DIAGNOSIS — M720 Palmar fascial fibromatosis [Dupuytren]: Secondary | ICD-10-CM | POA: Diagnosis not present

## 2024-08-05 DIAGNOSIS — S6412XD Injury of median nerve at wrist and hand level of left arm, subsequent encounter: Secondary | ICD-10-CM | POA: Diagnosis not present

## 2024-08-05 DIAGNOSIS — S5402XD Injury of ulnar nerve at forearm level, left arm, subsequent encounter: Secondary | ICD-10-CM | POA: Diagnosis not present

## 2024-08-19 DIAGNOSIS — Z794 Long term (current) use of insulin: Secondary | ICD-10-CM | POA: Diagnosis not present

## 2024-08-19 DIAGNOSIS — E1149 Type 2 diabetes mellitus with other diabetic neurological complication: Secondary | ICD-10-CM | POA: Diagnosis not present

## 2024-08-25 DIAGNOSIS — S6412XD Injury of median nerve at wrist and hand level of left arm, subsequent encounter: Secondary | ICD-10-CM | POA: Diagnosis not present

## 2024-08-25 DIAGNOSIS — M720 Palmar fascial fibromatosis [Dupuytren]: Secondary | ICD-10-CM | POA: Diagnosis not present

## 2024-08-25 DIAGNOSIS — S5402XD Injury of ulnar nerve at forearm level, left arm, subsequent encounter: Secondary | ICD-10-CM | POA: Diagnosis not present

## 2024-09-01 ENCOUNTER — Ambulatory Visit: Admitting: Gastroenterology

## 2024-09-08 ENCOUNTER — Encounter (HOSPITAL_COMMUNITY): Payer: Self-pay

## 2024-09-13 NOTE — Progress Notes (Unsigned)
 "   Cardiology Office Note   Date:  09/14/2024   ID:  Micheal Gretel Raddle., DOB 1961-01-03, MRN [id number]  PCP:  Fields Ade, MD  Cardiologist:   [name], MD    Pt presents for evaluation of CP     History of Present Illness: Micheal Matusek. is a 64 y.o. male with a history of T2DM (on insulin  pump for 10 years).  He is followed by Micheal Fields. CT calcium  score in May 2025 Score was 243.    The pt is active   Works in yard, goes to gym Aug 2025  Pt developed CP with exertion. Relieved with rest  Seen by APP at [facility] on PPI   Pain different than GERD however.   Continued to have CP  Oct 2025  Seen in cardiology clinic   LHC done showing 95 to 99% prox LAD Large D1, Large LCx Large OM3, PDA,  PLSA. Ramus Large   RCA nondominant. Pt went on to have PTCA/DES to LAD   Had spasm to LAD   Started on 5 mg amlodipine    I saw the pt in Nov 2025  Since seen he has done well  Very active   Walking, on treadmill, weight lifting  Had cough a couple weeks ago  Rx prednisone 10 mg   Gone.    Notes occasiona fluttery feeling in chest  Not CP like he had previously.  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 (Patient taking differently: Take 5 mg by mouth at bedtime.)   aspirin  EC 81 MG tablet Take 1 tablet (81 mg total) by mouth daily. Swallow whole.   atorvastatin  (LIPITOR) 40 MG tablet Take 1 tablet (40 mg total) by mouth daily.   Cholecalciferol 25 MCG (1000 UT) CHEW Chew 5,000 Units by mouth daily.   clopidogrel  (PLAVIX ) 75 MG tablet Take 1 tablet (75 mg total) by mouth daily.   Cyanocobalamin (VITAMIN B-12 CR PO) Take 1,000 mcg by mouth daily.   gabapentin (NEURONTIN) 100 MG capsule Take 100 mg by mouth at bedtime.   glucose blood (ONETOUCH VERIO) test strip TEST 4 TIMES DAILY AS DIRECTED   insulin   aspart (NOVOLOG) 100 UNIT/ML injection Inject into the skin. Insulin  pump   Insulin  Disposable Pump (V-GO 30) KIT USE 1 DEVICE DAILY - 30 UNITS BASAL, UP TO 36 UNITS BOLUS PER DAY   JARDIANCE 25 MG TABS tablet Take 25 mg by mouth in the morning.   lisinopril (ZESTRIL) 20 MG tablet Take 20 mg by mouth 2 (two) times daily. (Patient taking differently: Take 20 mg by mouth daily. Per patient taking in the morning)   metFORMIN (GLUCOPHAGE) 500 MG tablet Take 500 mg by mouth 2 (two) times daily with a meal.   nitroGLYCERIN  (NITROSTAT ) 0.4 MG SL tablet Place 1 tablet (0.4 mg total) under the tongue every 5 (five) minutes as needed for chest pain.   ONE TOUCH ULTRA TEST test strip    pantoprazole (PROTONIX) 40 MG tablet Take 40 mg by mouth 2 (two) times daily. (Patient taking differently: Take 40 mg by mouth daily.)   predniSONE (DELTASONE) 10 MG tablet TAKE 1 TABLET BY MOUTH DAILY WITH FOOD OR MILK   Probiotic Product (PROBIOTIC PO) Take 1 capsule by  mouth daily. 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     Past Surgical History:  Procedure Laterality Date   COLONOSCOPY     CORONARY STENT INTERVENTION N/A 06/22/2024   Procedure: CORONARY STENT INTERVENTION;  Surgeon: Ladona Heinz, MD;  Location: [facility] INVASIVE CV LAB;  Service: Cardiovascular;  Laterality: N/A;   CORONARY ULTRASOUND/IVUS N/A 06/22/2024   Procedure: Coronary Ultrasound/IVUS;  Surgeon: Ladona Heinz, MD;  Location: [facility] INVASIVE CV LAB;  Service: Cardiovascular;  Laterality: N/A;   ELBOW  SURGERY     left elbow   LAMINECTOMY     l 4-5   LEFT HEART CATH AND CORONARY ANGIOGRAPHY N/A 06/22/2024   Procedure: LEFT HEART CATH AND CORONARY ANGIOGRAPHY;  Surgeon: Ladona Heinz, MD;  Location: [facility] INVASIVE CV LAB;  Service: Cardiovascular;  Laterality: N/A;   TRIGGER FINGER RELEASE Right    middle finger   UPPER GASTROINTESTINAL ENDOSCOPY     WISDOM TOOTH EXTRACTION       Social History:  The patient  reports that he has never smoked. He has quit using smokeless tobacco.  His smokeless tobacco use included chew. He reports current alcohol  use of about 6.0 standard drinks of alcohol  per week. He reports that he does not use drugs.   Family History:  The patient's family history includes Colon cancer (age of onset: 79) in his father; Colon polyps in his father; Diabetes in his mother.    ROS:  Please see the history of present illness. All other systems are reviewed and  Negative to the above problem except as noted.    PHYSICAL EXAM: VS:  BP 110/62 (BP Location: Left Arm, Patient Position: Sitting, Cuff Size: Normal)   Pulse 69   Ht 5' 10 (1.778 m)   Wt 202 lb 3.2 oz (91.7 kg)   SpO2 98%   BMI 29.01 kg/m   GEN: Pt is in no acute distress  HEENT: normal  Neck: no JVD, carotid bruits Cardiac: RRR; no murmur Respiratory:  clear to auscultation  GI: soft, nontender No hepatomegaly  No LE edema    EKG:  EKG is ordered today.  NSR 69 bpm   Nonspecific ST changes with    Lipid Panel No results found for: CHOL, TRIG, HDL, CHOLHDL, VLDL, LDLCALC, LDLDIRECT    Wt Readings from Last 3 Encounters:  09/14/24 202 lb 3.2 oz (91.7 kg)  06/22/24 202 lb (91.6 kg)  06/18/24 204 lb 12.8 oz (92.9 kg)      ASSESSMENT AND PLAN:  1  CAD  Pt s/p PTCA/DES to prox LAD in Oct 2025  Doing well No symptoms of angina Keep on ASA and Plavix  for 6 months the single agent Also started on amlodipine  for coronary spasm noted at cath   2  HL   LIpids in Oct 2025 LDL 70  HDL 51   Trig 74  Lpa 38  Apo B 64  Will follow over time   Keep on atrovastatin 40    3 DM  A1C in Oct 2025 was 7   WIll review with endocrine    Diet is improving    4  HTN  BP is very  well controlled  With addition of amlodipine , pt had to back down on lisinopril to 20 mg daily   WIll plan for follow up in 6 months.     Current medicines are reviewed at length with the patient today.  The patient does not have concerns regarding medicines.  Signed, [name], MD  "

## 2024-09-14 ENCOUNTER — Encounter: Payer: Self-pay | Admitting: Internal Medicine

## 2024-09-14 ENCOUNTER — Other Ambulatory Visit (HOSPITAL_COMMUNITY): Payer: Self-pay

## 2024-09-14 ENCOUNTER — Ambulatory Visit: Attending: Cardiology | Admitting: Internal Medicine

## 2024-09-14 VITALS — BP 110/62 | HR 69 | Ht 70.0 in | Wt 202.2 lb

## 2024-09-14 DIAGNOSIS — I10 Essential (primary) hypertension: Secondary | ICD-10-CM | POA: Diagnosis not present

## 2024-09-14 DIAGNOSIS — Z9861 Coronary angioplasty status: Secondary | ICD-10-CM

## 2024-09-14 DIAGNOSIS — I2510 Atherosclerotic heart disease of native coronary artery without angina pectoris: Secondary | ICD-10-CM | POA: Diagnosis not present

## 2024-09-14 MED ORDER — CLOPIDOGREL BISULFATE 75 MG PO TABS
75.0000 mg | ORAL_TABLET | Freq: Every day | ORAL | 3 refills | Status: AC
  Filled 2024-09-14: qty 90, 90d supply, fill #0

## 2024-09-14 NOTE — Patient Instructions (Signed)
 Medication Instructions:  Your physician recommends that you continue on your current medications as directed. Please refer to the Current Medication list given to you today.   *If you need a refill on your cardiac medications before your next appointment, please call your pharmacy*  Lab Work: NONE   If you have labs (blood work) drawn today and your tests are completely normal, you will receive your results only by: MyChart Message (if you have MyChart) OR A paper copy in the mail If you have any lab test that is abnormal or we need to change your treatment, we will call you to review the results.  Testing/Procedures: NONE   Follow-Up: At [facility], you and your health needs are our priority.  As part of our continuing mission to provide you with exceptional heart care, our providers are all part of one team.  This team includes your primary Cardiologist (physician) and Advanced Practice Providers or APPs (Physician Assistants and Nurse Practitioners) who all work together to provide you with the care you need, when you need it.  Your next appointment:   1 year(s)  Provider:   [name], MD
